# Patient Record
Sex: Female | Born: 1960 | Race: White | Hispanic: No | Marital: Single | Smoking: Current some day smoker
Health system: Southern US, Community
[De-identification: ages and names within clinical notes are randomized; demographics above are authoritative.]

## PROBLEM LIST (undated history)

## (undated) DIAGNOSIS — C801 Malignant (primary) neoplasm, unspecified: Secondary | ICD-10-CM

## (undated) DIAGNOSIS — E785 Hyperlipidemia, unspecified: Secondary | ICD-10-CM

## (undated) DIAGNOSIS — I219 Acute myocardial infarction, unspecified: Secondary | ICD-10-CM

## (undated) DIAGNOSIS — Z7901 Long term (current) use of anticoagulants: Secondary | ICD-10-CM

## (undated) DIAGNOSIS — I2510 Atherosclerotic heart disease of native coronary artery without angina pectoris: Secondary | ICD-10-CM

## (undated) DIAGNOSIS — J449 Chronic obstructive pulmonary disease, unspecified: Secondary | ICD-10-CM

## (undated) DIAGNOSIS — D45 Polycythemia vera: Secondary | ICD-10-CM

## (undated) DIAGNOSIS — I10 Essential (primary) hypertension: Secondary | ICD-10-CM

## (undated) DIAGNOSIS — N189 Chronic kidney disease, unspecified: Secondary | ICD-10-CM

## (undated) DIAGNOSIS — E119 Type 2 diabetes mellitus without complications: Secondary | ICD-10-CM

## (undated) DIAGNOSIS — R0600 Dyspnea, unspecified: Secondary | ICD-10-CM

## (undated) HISTORY — PX: LUNG LOBECTOMY: SHX167

## (undated) HISTORY — PX: PERICARDIAL WINDOW: SHX2213

---
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
## 2003-01-16 HISTORY — PX: PERICARDIAL WINDOW: SHX2213

## 2014-01-15 HISTORY — PX: LUNG LOBECTOMY: SHX167

## 2014-05-07 DIAGNOSIS — C3491 Malignant neoplasm of unspecified part of right bronchus or lung: Secondary | ICD-10-CM

## 2014-05-07 HISTORY — DX: Malignant neoplasm of unspecified part of right bronchus or lung: C34.91

## 2017-01-23 DIAGNOSIS — J449 Chronic obstructive pulmonary disease, unspecified: Secondary | ICD-10-CM | POA: Diagnosis present

## 2017-01-23 DIAGNOSIS — J441 Chronic obstructive pulmonary disease with (acute) exacerbation: Secondary | ICD-10-CM

## 2017-02-15 DIAGNOSIS — I219 Acute myocardial infarction, unspecified: Secondary | ICD-10-CM

## 2017-02-15 HISTORY — PX: CORONARY ANGIOPLASTY WITH STENT PLACEMENT: SHX49

## 2017-02-15 HISTORY — DX: Acute myocardial infarction, unspecified: I21.9

## 2017-02-17 HISTORY — PX: CORONARY ANGIOPLASTY WITH STENT PLACEMENT: SHX49

## 2017-02-24 DIAGNOSIS — K219 Gastro-esophageal reflux disease without esophagitis: Secondary | ICD-10-CM | POA: Diagnosis present

## 2017-02-24 DIAGNOSIS — E119 Type 2 diabetes mellitus without complications: Secondary | ICD-10-CM

## 2017-04-03 DIAGNOSIS — I2510 Atherosclerotic heart disease of native coronary artery without angina pectoris: Secondary | ICD-10-CM | POA: Diagnosis present

## 2017-11-13 ENCOUNTER — Ambulatory Visit
Admission: EM | Admit: 2017-11-13 | Discharge: 2017-11-13 | Disposition: A | Discharge status: Home / Self Care | Payer: Self-pay

## 2017-11-13 ENCOUNTER — Other Ambulatory Visit: Payer: Self-pay

## 2017-11-13 ENCOUNTER — Encounter: Payer: Self-pay | Admitting: Emergency Medicine

## 2017-11-13 DIAGNOSIS — I10 Essential (primary) hypertension: Secondary | ICD-10-CM

## 2017-11-13 DIAGNOSIS — R7309 Other abnormal glucose: Secondary | ICD-10-CM

## 2017-11-13 DIAGNOSIS — J449 Chronic obstructive pulmonary disease, unspecified: Secondary | ICD-10-CM

## 2017-11-13 DIAGNOSIS — R0982 Postnasal drip: Secondary | ICD-10-CM

## 2017-11-13 DIAGNOSIS — D45 Polycythemia vera: Secondary | ICD-10-CM

## 2017-11-13 DIAGNOSIS — E7849 Other hyperlipidemia: Secondary | ICD-10-CM

## 2017-11-13 HISTORY — DX: Polycythemia vera: D45

## 2017-11-13 HISTORY — DX: Acute myocardial infarction, unspecified: I21.9

## 2017-11-13 HISTORY — DX: Essential (primary) hypertension: I10

## 2017-11-13 HISTORY — DX: Malignant (primary) neoplasm, unspecified: C80.1

## 2017-11-13 LAB — COMPREHENSIVE METABOLIC PANEL
ALT: 21 U/L (ref 0–44)
ANION GAP: 12 (ref 5–15)
AST: 23 U/L (ref 15–41)
Albumin: 3.7 g/dL (ref 3.5–5.0)
Alkaline Phosphatase: 128 U/L — ABNORMAL HIGH (ref 38–126)
BILIRUBIN TOTAL: 0.4 mg/dL (ref 0.3–1.2)
BUN: 14 mg/dL (ref 6–20)
CHLORIDE: 98 mmol/L (ref 98–111)
CO2: 29 mmol/L (ref 22–32)
Calcium: 9.6 mg/dL (ref 8.9–10.3)
Creatinine, Ser: 0.78 mg/dL (ref 0.44–1.00)
Glucose, Bld: 143 mg/dL — ABNORMAL HIGH (ref 70–99)
POTASSIUM: 3.9 mmol/L (ref 3.5–5.1)
Sodium: 139 mmol/L (ref 135–145)
TOTAL PROTEIN: 7.8 g/dL (ref 6.5–8.1)

## 2017-11-13 MED ORDER — ALBUTEROL SULFATE HFA 108 (90 BASE) MCG/ACT IN AERS: 2.0000 | INHALATION_SPRAY | Freq: Four times a day (QID) | RESPIRATORY_TRACT | 1 refills | Status: DC | PRN

## 2017-11-13 MED ORDER — TRIAMTERENE-HCTZ 37.5-25 MG PO TABS: 1.0000 | ORAL_TABLET | Freq: Every day | ORAL | 1 refills | Status: DC

## 2017-11-13 MED ORDER — AMLODIPINE BESYLATE 10 MG PO TABS: 10.0000 mg | ORAL_TABLET | Freq: Every day | ORAL | 1 refills | Status: AC

## 2017-11-13 MED ORDER — LOSARTAN POTASSIUM 100 MG PO TABS: 100.0000 mg | ORAL_TABLET | Freq: Every day | ORAL | 1 refills | Status: AC

## 2017-11-13 MED ORDER — CLONIDINE HCL 0.1 MG PO TABS
0.1000 mg | ORAL_TABLET | Freq: Once | ORAL | Status: AC
  Administered 2017-11-13: 0.1 mg via ORAL

## 2017-11-13 MED ORDER — BENZONATATE 100 MG PO CAPS: 100.0000 mg | ORAL_CAPSULE | Freq: Three times a day (TID) | ORAL | 1 refills | Status: DC | PRN

## 2017-11-13 MED ORDER — SIMVASTATIN 20 MG PO TABS: 20.0000 mg | ORAL_TABLET | Freq: Every day | ORAL | 1 refills | Status: AC

## 2017-11-13 MED ORDER — CLOPIDOGREL BISULFATE 75 MG PO TABS: 75.0000 mg | ORAL_TABLET | Freq: Every day | ORAL | 1 refills | Status: DC

## 2017-11-13 MED ORDER — METOPROLOL SUCCINATE ER 100 MG PO TB24: 100.0000 mg | ORAL_TABLET | Freq: Every day | ORAL | 1 refills | Status: AC

## 2017-11-13 MED ORDER — TIOTROPIUM BROMIDE-OLODATEROL 2.5-2.5 MCG/ACT IN AERS: 2.0000 | INHALATION_SPRAY | Freq: Every day | RESPIRATORY_TRACT | 1 refills | Status: AC

## 2017-11-13 NOTE — ED Triage Notes (Signed)
Pt just moved here from [place] and is almost out of her blood pressure medication. She takes Losartan 100 mg, amlodipine 10 mg, metoprolol 100 mg, Triamterene/HCTZ, 37.5 mg -25 mg. She would like them printed out so that she use [facility] to see where her rx may be cheaper because her insurance doe snot go into effect until December.

## 2017-11-13 NOTE — Discharge Instructions (Addendum)
You were given Clonidine today to help lower your blood pressure. Recommend restart all your blood pressure medication. Restart Losartan 100mg  daily, Amlodipine 10mg , Toprol XL 100mg , Maxzide 25mg  daily. Continue Plavix 75mg  and aspirin 81mg  daily. Restart Simvastatin 20mg  daily. May continue Stiolto 2 puffs daily and Albuterol 2 puffs every 6 hours as needed. May use Claritin 10mg  daily and Benzonatate cough pills as needed. Call a PCP to schedule appointment for continued care and for referral to specialist for management of chronic health issues. If any chest pain, worsening dyspnea, worsening headache, dizziness, blurred vision, or syncope occurs, go to the ER ASAP. Otherwise follow-up with a PCP as planned.

## 2017-11-13 NOTE — ED Provider Notes (Signed)
[facility]    CSN: [id number] Arrival date & time: 11/13/17  1632     History   Chief Complaint Chief Complaint  Patient presents with  . Hypertension    HPI Nancy Combs is a 56 y.o. female.   57 year old female presents with multiple chronic health issues. She recently moved to [place] from [place] and has not found a PCP yet. She has a history of HTN, COPD, CAD with MI and stent placed in 02/2017. She also has a history of lung Cancer that was dx in 2016 with right lobectomy, chronic post nasal drainage and polycythemia vera. She would have blood draws to remove a pint of blood for tx. She has not had this treatment since April/May. She continues to smoke and had tried nicotine patches but had a reaction to the adhesive. She has been rationing her blood pressure medication and ran out of her diuretic and Toprol 3-4 weeks ago. She just obtained employment but her insurance does not start until December 2019. She has tried to make an appointment with local PCP but she can not get an appointment until November. She is having headaches due to her elevated blood pressure. She is also wheezing and getting short of breath since she also does not have her inhalers. Last visit with her PCP in [place] was in July with labwork done in June 2019. They had diagnosed her type 2 DM but she is not currently being treated. Uncertain if elevated OVFI4P due to polycythemia vera or a true result. She denies any fever, sore throat, vision changes, dizziness, chest pain or palpitations. She is requesting written Rx for all her medications if possible so that she can use Good Rx and find low-cost options. She will need referrals from a PCP for a local Oncologist, Hematologist, Cardiologist, and Pulmonologist for management of her chronic health conditions.   The history is provided by the patient.    Past Medical History:  Diagnosis Date  . Cancer ([place])    lung  . Hypertension   .  MI (myocardial infarction) ([place])   . PV (polycythemia vera) ([facility])     There are no active problems to display for this patient.   Past Surgical History:  Procedure Laterality Date  . CESAREAN SECTION    . CORONARY ANGIOPLASTY WITH STENT PLACEMENT  02/2017  . LUNG LOBECTOMY Right   . PERICARDIAL WINDOW      OB History   None      Home Medications    Prior to Admission medications   Medication Sig Start Date End Date Taking? Authorizing Provider  aspirin EC 81 MG tablet Take 81 mg by mouth daily.   Yes [provider]  folic acid (FOLVITE) 1 MG tablet Take 1 mg by mouth daily.   Yes [provider]  loratadine (CLARITIN) 10 MG tablet Take 10 mg by mouth daily.   Yes [provider]  albuterol (PROVENTIL HFA;VENTOLIN HFA) 108 (90 Base) MCG/ACT inhaler Inhale 2 puffs into the lungs every 6 (six) hours as needed for wheezing or shortness of breath. 11/13/17   Katy Apo, NP  amLODipine (NORVASC) 10 MG tablet Take 1 tablet (10 mg total) by mouth daily. 11/13/17   Katy Apo, NP  benzonatate (TESSALON) 100 MG capsule Take 1 capsule (100 mg total) by mouth 3 (three) times daily as needed for cough. 11/13/17   Katy Apo, NP  clopidogrel (PLAVIX) 75 MG tablet Take 1 tablet (  75 mg total) by mouth daily. 11/13/17   Katy Apo, NP  losartan (COZAAR) 100 MG tablet Take 1 tablet (100 mg total) by mouth daily. 11/13/17   Katy Apo, NP  metoprolol succinate (TOPROL-XL) 100 MG 24 hr tablet Take 1 tablet (100 mg total) by mouth daily. Take with or immediately following a meal. 11/13/17   Lethia Donlon, Nicholes Stairs, NP  simvastatin (ZOCOR) 20 MG tablet Take 1 tablet (20 mg total) by mouth daily. 11/13/17   Katy Apo, NP  Tiotropium Bromide-Olodaterol (STIOLTO RESPIMAT) 2.5-2.5 MCG/ACT AERS Inhale 2 puffs into the lungs daily. 11/13/17   Katy Apo, NP  triamterene-hydrochlorothiazide (MAXZIDE-25) 37.5-25 MG tablet Take 1 tablet by mouth  daily. 11/13/17   Katy Apo, NP    Family History Family History  Problem Relation Age of Onset  . Diabetes Mother   . Hypertension Mother     Social History Social History   Tobacco Use  . Smoking status: Current Every Day Smoker    Packs/day: 0.50    Types: Cigarettes  . Smokeless tobacco: Never Used  Substance Use Topics  . Alcohol use: Not on file  . Drug use: Never     Allergies   Patient has no known allergies.   Review of Systems Review of Systems  Constitutional: Positive for activity change (unable to walk up the stairs without wheezing) and fatigue. Negative for appetite change, chills, diaphoresis, fever and unexpected weight change.  HENT: Positive for postnasal drip. Negative for congestion, ear discharge, ear pain, facial swelling, mouth sores, nosebleeds, rhinorrhea, sinus pressure, sinus pain, sneezing, sore throat and trouble swallowing.   Eyes: Negative for photophobia and visual disturbance.  Respiratory: Positive for cough, shortness of breath and wheezing. Negative for chest tightness and stridor.   Cardiovascular: Positive for leg swelling (bilateral lower ankle swelling). Negative for chest pain and palpitations.  Gastrointestinal: Negative for abdominal pain, diarrhea, nausea and vomiting.  Endocrine: Positive for polyuria.  Genitourinary: Positive for frequency. Negative for decreased urine volume, difficulty urinating, dysuria, flank pain, hematuria and urgency.  Musculoskeletal: Negative for arthralgias, back pain, myalgias and neck pain.  Skin: Negative for color change, rash and wound.  Neurological: Positive for headaches. Negative for dizziness, tremors, seizures, syncope, speech difficulty, weakness, light-headedness and numbness.  Hematological: Negative for adenopathy. Bruises/bleeds easily (on Plavix).     Physical Exam Triage Vital Signs ED Triage Vitals  Enc Vitals Group     BP 11/13/17 1704 (!) 234/113     Pulse Rate  11/13/17 1704 82     Resp 11/13/17 1704 18     Temp 11/13/17 1704 97.9 F (36.6 C)     Temp Source 11/13/17 1704 Oral     SpO2 11/13/17 1704 100 %     Weight 11/13/17 1659 220 lb (99.8 kg)     Height 11/13/17 1659 5' 2.75" (1.594 m)     Head Circumference --      Peak Flow --      Pain Score 11/13/17 1659 0     Pain Loc --      Pain Edu? --      Excl. in [place]? --    No data found.  Updated Vital Signs BP (!) 185/104 (BP Location: Left Arm)   Pulse 82   Temp 97.9 F (36.6 C) (Oral)   Resp 18   Ht 5' 2.75" (1.594 m)   Wt 220 lb (99.8 kg)   SpO2 100%   BMI 39.28  kg/m   Visual Acuity Right Eye Distance:   Left Eye Distance:   Bilateral Distance:    Right Eye Near:   Left Eye Near:    Bilateral Near:     Physical Exam  Constitutional: She is oriented to person, place, and time. She appears well-developed and well-nourished. She is cooperative. She does not appear ill. No distress.  Patient sitting comfortably on exam table in no acute distress. Is currently asymptomatic except for headache.   HENT:  Head: Normocephalic and atraumatic.  Right Ear: Hearing, tympanic membrane, external ear and ear canal normal.  Left Ear: Hearing, tympanic membrane, external ear and ear canal normal.  Nose: Nose normal.  Mouth/Throat: Uvula is midline, oropharynx is clear and moist and mucous membranes are normal.  Eyes: Pupils are equal, round, and reactive to light. Conjunctivae and EOM are normal.  Neck: Normal range of motion. Neck supple.  Cardiovascular: Normal rate, regular rhythm, normal heart sounds, intact distal pulses and normal pulses.  No murmur heard. Pulmonary/Chest: Effort normal. No stridor. No respiratory distress. She has wheezes in the right upper field, the right lower field, the left upper field and the left lower field. She has no rhonchi. She has no rales.  Musculoskeletal: Normal range of motion.       Right ankle: She exhibits swelling. She exhibits normal range  of motion, no ecchymosis and normal pulse.       Left ankle: She exhibits swelling. She exhibits normal range of motion, no ecchymosis and normal pulse.  Bilateral 2+ non-pitting edema present. Normal 2+ dorsal pedal pulses.    Lymphadenopathy:    She has no cervical adenopathy.  Neurological: She is alert and oriented to person, place, and time.  Skin: Skin is warm, dry and intact. Capillary refill takes less than 2 seconds. No rash noted. Nails show clubbing.  Psychiatric: She has a normal mood and affect. Her behavior is normal. Thought content normal. Her speech is rapid and/or pressured. Cognition and memory are normal.  Vitals reviewed.    UC Treatments / Results  Labs (all labs ordered are listed, but only abnormal results are displayed) Labs Reviewed  COMPREHENSIVE METABOLIC PANEL - Abnormal; Notable for the following components:      Result Value   Glucose, Bld 143 (*)    Alkaline Phosphatase 128 (*)    All other components within normal limits    EKG None  Radiology No results found.  Procedures Procedures (including critical care time)  Medications Ordered in UC Medications  cloNIDine (CATAPRES) tablet 0.1 mg (0.1 mg Oral Given 11/13/17 1750)    Initial Impression / Assessment and Plan / UC Course  I have reviewed the triage vital signs and the nursing notes.  Pertinent labs & imaging results that were available during my care of the patient were reviewed by me and considered in my medical decision making (see chart for details).    Blood pressure significantly elevated but patient stable and indicates that it has been this high before. Gave Clonidine 0.1mg  now to help decrease blood pressure. Reviewed metabolic panel lab results with patient- only distinct abnormality is elevated glucose. Normal kidney function. Will not start diabetic medication at this time since glucose not severely elevated - patient is asymptomatic and no findings suggestive of DKA. Will  allow PCP to manage abnormal glucose. Recommend restart blood pressure medications- Losartan 100mg , Amilodopine 10mg , Toprol XL 100mg , Maxzide 25mg  daily. Continue Plavix 75mg  and aspirin 81mg  daily. Restart Simvastatin- patient was  on Lipitor but thinks it caused her to cough so was switched to Zocor but uncertain. Will restart Zocor at 20mg  but should be adjusted by PCP. Restart Stiolto 2 puffs AM and Albuterol 2 puffs every 6 hours prn for COPD. May use Tessalon cough pills and Claritin 10mg  as directed for PND. All prescriptions printed so patient can determine most cost-effective pharmacy for meds.  Call PCP tomorrow to schedule appointment for continued care. Recommend assistance in finding PCP due to no current insurance and new to area. If any chest pain, worsening dyspnea, dizziness, syncope or worsening of headache occurs, go to ER ASAP. Otherwise follow-up with a PCP as planned.  Final Clinical Impressions(s) / UC Diagnoses   Final diagnoses:  Essential hypertension  Elevated blood pressure reading with diagnosis of hypertension  Other hyperlipidemia  COPD, moderate ([facility]CC)  Post-nasal drainage  Polycythemia vera ([facility]CC)  Elevated glucose level     Discharge Instructions     You were given Clonidine today to help lower your blood pressure. Recommend restart all your blood pressure medication. Restart Losartan 100mg  daily, Amlodipine 10mg , Toprol XL 100mg , Maxzide 25mg  daily. Continue Plavix 75mg  and aspirin 81mg  daily. Restart Simvastatin 20mg  daily. May continue Stiolto 2 puffs daily and Albuterol 2 puffs every 6 hours as needed. May use Claritin 10mg  daily and Benzonatate cough pills as needed. Call a PCP to schedule appointment for continued care and for referral to specialist for management of chronic health issues. If any chest pain, worsening dyspnea, worsening headache, dizziness, blurred vision, or syncope occurs, go to the ER ASAP. Otherwise follow-up with a PCP as planned.     ED  Prescriptions    Medication Sig Dispense Auth. Provider   amLODipine (NORVASC) 10 MG tablet Take 1 tablet (10 mg total) by mouth daily. 30 tablet Katy Apo, NP   losartan (COZAAR) 100 MG tablet Take 1 tablet (100 mg total) by mouth daily. 30 tablet Katy Apo, NP   metoprolol succinate (TOPROL-XL) 100 MG 24 hr tablet Take 1 tablet (100 mg total) by mouth daily. Take with or immediately following a meal. 30 tablet Kemoni Ortega, Nicholes Stairs, NP   triamterene-hydrochlorothiazide (MAXZIDE-25) 37.5-25 MG tablet Take 1 tablet by mouth daily. 30 tablet Katy Apo, NP   clopidogrel (PLAVIX) 75 MG tablet Take 1 tablet (75 mg total) by mouth daily. 30 tablet Katy Apo, NP   simvastatin (ZOCOR) 20 MG tablet Take 1 tablet (20 mg total) by mouth daily. 30 tablet Katy Apo, NP   albuterol (PROVENTIL HFA;VENTOLIN HFA) 108 (90 Base) MCG/ACT inhaler Inhale 2 puffs into the lungs every 6 (six) hours as needed for wheezing or shortness of breath. 1 Inhaler Yeraldine Forney, Nicholes Stairs, NP   benzonatate (TESSALON) 100 MG capsule Take 1 capsule (100 mg total) by mouth 3 (three) times daily as needed for cough. 30 capsule Katy Apo, NP   Tiotropium Bromide-Olodaterol (STIOLTO RESPIMAT) 2.5-2.5 MCG/ACT AERS Inhale 2 puffs into the lungs daily. 1 Inhaler Wanna Gully, Nicholes Stairs, NP     Controlled Substance Prescriptions [place] Controlled Substance Registry consulted? No.    Katy Apo, NP 11/14/17 [phone]

## 2019-04-03 ENCOUNTER — Other Ambulatory Visit: Payer: Self-pay | Admitting: Obstetrics and Gynecology

## 2019-04-03 DIAGNOSIS — Z1231 Encounter for screening mammogram for malignant neoplasm of breast: Secondary | ICD-10-CM

## 2019-04-10 ENCOUNTER — Other Ambulatory Visit: Payer: Self-pay | Admitting: Orthopedic Surgery

## 2019-04-10 DIAGNOSIS — M2392 Unspecified internal derangement of left knee: Secondary | ICD-10-CM

## 2019-05-11 ENCOUNTER — Inpatient Hospital Stay: Admission: RE | Admit: 2019-05-11 | Payer: Self-pay | Source: Ambulatory Visit

## 2019-11-05 ENCOUNTER — Other Ambulatory Visit: Payer: Self-pay | Admitting: Surgery

## 2019-11-12 ENCOUNTER — Inpatient Hospital Stay: Admission: RE | Admit: 2019-11-12 | Payer: Self-pay | Source: Ambulatory Visit

## 2019-11-12 ENCOUNTER — Encounter: Payer: Self-pay | Admitting: Surgery

## 2019-11-12 ENCOUNTER — Other Ambulatory Visit: Payer: Self-pay

## 2019-11-12 ENCOUNTER — Encounter
Admission: RE | Admit: 2019-11-12 | Discharge: 2019-11-12 | Disposition: A | Discharge status: Home / Self Care | Payer: BC Managed Care – PPO | Source: Ambulatory Visit | Attending: Surgery | Admitting: Surgery

## 2019-11-12 DIAGNOSIS — Z01818 Encounter for other preprocedural examination: Secondary | ICD-10-CM | POA: Diagnosis present

## 2019-11-12 DIAGNOSIS — Z01810 Encounter for preprocedural cardiovascular examination: Secondary | ICD-10-CM

## 2019-11-12 HISTORY — DX: Chronic kidney disease, unspecified: N18.9

## 2019-11-12 HISTORY — DX: Chronic obstructive pulmonary disease, unspecified: J44.9

## 2019-11-12 HISTORY — DX: Type 2 diabetes mellitus without complications: E11.9

## 2019-11-12 HISTORY — DX: Dyspnea, unspecified: R06.00

## 2019-11-12 LAB — URINALYSIS, ROUTINE W REFLEX MICROSCOPIC
Bilirubin Urine: NEGATIVE
Glucose, UA: NEGATIVE mg/dL
Hgb urine dipstick: NEGATIVE
Ketones, ur: NEGATIVE mg/dL
Leukocytes,Ua: NEGATIVE
Nitrite: NEGATIVE
Protein, ur: 100 mg/dL — AB
Specific Gravity, Urine: 1.014 (ref 1.005–1.030)
pH: 7 (ref 5.0–8.0)

## 2019-11-12 LAB — CBC WITH DIFFERENTIAL/PLATELET
Abs Immature Granulocytes: 0.15 10*3/uL — ABNORMAL HIGH (ref 0.00–0.07)
Basophils Absolute: 0.1 10*3/uL (ref 0.0–0.1)
Basophils Relative: 1 %
Eosinophils Absolute: 0.3 10*3/uL (ref 0.0–0.5)
Eosinophils Relative: 2 %
HCT: 43.1 % (ref 36.0–46.0)
Hemoglobin: 14.7 g/dL (ref 12.0–15.0)
Immature Granulocytes: 1 %
Lymphocytes Relative: 17 %
Lymphs Abs: 2.2 10*3/uL (ref 0.7–4.0)
MCH: 30.1 pg (ref 26.0–34.0)
MCHC: 34.1 g/dL (ref 30.0–36.0)
MCV: 88.3 fL (ref 80.0–100.0)
Monocytes Absolute: 0.9 10*3/uL (ref 0.1–1.0)
Monocytes Relative: 7 %
Neutro Abs: 9 10*3/uL — ABNORMAL HIGH (ref 1.7–7.7)
Neutrophils Relative %: 72 %
Platelets: 294 10*3/uL (ref 150–400)
RBC: 4.88 MIL/uL (ref 3.87–5.11)
RDW: 13.5 % (ref 11.5–15.5)
WBC: 12.6 10*3/uL — ABNORMAL HIGH (ref 4.0–10.5)
nRBC: 0 % (ref 0.0–0.2)

## 2019-11-12 LAB — SURGICAL PCR SCREEN
MRSA, PCR: NEGATIVE
Staphylococcus aureus: NEGATIVE

## 2019-11-12 LAB — COMPREHENSIVE METABOLIC PANEL
ALT: 19 U/L (ref 0–44)
AST: 14 U/L — ABNORMAL LOW (ref 15–41)
Albumin: 3.5 g/dL (ref 3.5–5.0)
Alkaline Phosphatase: 101 U/L (ref 38–126)
Anion gap: 9 (ref 5–15)
BUN: 18 mg/dL (ref 6–20)
CO2: 33 mmol/L — ABNORMAL HIGH (ref 22–32)
Calcium: 9.7 mg/dL (ref 8.9–10.3)
Chloride: 96 mmol/L — ABNORMAL LOW (ref 98–111)
Creatinine, Ser: 0.85 mg/dL (ref 0.44–1.00)
GFR, Estimated: >60 mL/min (ref >60)
Glucose, Bld: 129 mg/dL — ABNORMAL HIGH (ref 70–99)
Potassium: 3.6 mmol/L (ref 3.5–5.1)
Sodium: 138 mmol/L (ref 135–145)
Total Bilirubin: 0.6 mg/dL (ref 0.3–1.2)
Total Protein: 7 g/dL (ref 6.5–8.1)

## 2019-11-12 LAB — TYPE AND SCREEN
ABO/RH(D): O POS
Antibody Screen: NEGATIVE

## 2019-11-12 NOTE — Patient Instructions (Signed)
Your procedure is scheduled on: Thursday November 19, 2019. Report to Day Surgery inside [facility] 2[facility] Floor. To find out your arrival time please call [phone] between 1PM - 3PM on Wednesday November 18, 2019.  Remember: Instructions that are not followed completely may result in serious medical risk,  up to and including death, or upon the discretion of your surgeon and anesthesiologist your  surgery may need to be rescheduled.     _X__ 1. Do not eat food after midnight the night before your procedure.                 No chewing gum or hard candies. You may drink clear liquids up to 2 hours                 before you are scheduled to arrive for your surgery- DO not drink clear                 liquids within 2 hours of the start of your surgery.                 Clear Liquids include:  water, apple juice without pulp, clear Gatorade, G2 or                  Gatorade Zero (avoid Red/Purple/Blue), Black Coffee or Tea (Do not add                 anything to coffee or tea).  __X__2.   Complete the "Ensure Clear Pre-surgery Clear Carbohydrate Drink" provided to you, 2 hours before arrival. **If you are diabetic you will be provided with an alternative drink, Gatorade Zero or G2.  __X__3.  On the morning of surgery brush your teeth with toothpaste and water, you                may rinse your mouth with mouthwash if you wish.  Do not swallow any toothpaste of mouthwash.     _X__4.  No Alcohol for 24 hours before or after surgery.   _X__5.  Do Not Smoke or use e-cigarettes For 24 Hours Prior to Your Surgery.                 Do not use any chewable tobacco products for at least 6 hours prior to                 Surgery.  _X__ 6.  Do not use any recreational drugs (marijuana, cocaine, heroin, ecstasy, MDMA or other)                For at least one week prior to your surgery.  Combination of these drugs with anesthesia                May have life threatening  results.  _X___  7.  Notify your doctor if there is any change in your medical condition      (cold, fever, infections).     Do not wear jewelry, make-up, hairpins, clips or nail polish. Do not wear lotions, powders, or perfumes. You may wear deodorant. Do not shave 48 hours prior to surgery. Men may shave face and neck. Do not bring valuables to the hospital.    [facility] is not responsible for any belongings or valuables.  Contacts, dentures or bridgework may not be worn into surgery. Leave your suitcase in the car. After surgery it may be brought to your room. For patients admitted to  the hospital, discharge time is determined by your treatment team.   Patients discharged the day of surgery will not be allowed to drive home.   Make arrangements for someone to be with you for the first 24 hours of your Same Day Discharge.   __X__ Take these medicines the morning of surgery with A SIP OF WATER:    1. amLODipine (NORVASC) 10 MG   2. metoprolol succinate (TOPROL-XL) 100 MG    ____ Fleet Enema (as directed)   __x__ Use CHG Soap as directed  ____ Use Benzoyl Peroxide Gel as instructed  __x__ Use inhalers on the day of surgery  albuterol (PROVENTIL HFA;VENTOLIN HFA) 108 (90 Base) MCG/ACT inhaler  Tiotropium Bromide-Olodaterol (STIOLTO RESPIMAT) 2.5-2.5 MCG/ACT AERS  __X__ Stop metformin 2 days prior to surgery (last dose will be Monday November 16, 2019)     ____ Take 1/2 of usual insulin dose the night before surgery. No insulin the morning          of surgery.   __x__ Stop clopidogrel (PLAVIX) and aspirin EC 81 MG  as instructed by your doctor   __x__ Stop Anti-inflammatories such as Ibuprofen, Aleve, Advil, naproxen and or BC powders.    __x__ Stop supplements until after surgery.    __x__ Do not start any herbal supplements before your procedure.    If you have any questions regarding your pre-procedure instructions,  Please call Pre-admit Testing at  [phone].

## 2019-11-12 NOTE — Progress Notes (Signed)
[facility] Perioperative Services  Pre-Admission/Anesthesia Testing Clinical Review  Date: 11/13/19  Patient Demographics:  Name: Nancy Combs DOB:   May 27, 1960 MRN:   [id number]  Planned Surgical Procedure(s):    Case: [id number] Date/Time: 11/19/19 0715   Procedure: TOTAL KNEE ARTHROPLASTY (Left Knee)   Anesthesia type: Choice   Pre-op diagnosis: PRIMARY OSTEOARTHRITIS OF LEFT KNEE   Location: [place] 03 / [place] [facility]S FOR ANESTHESIA GROUP   Surgeons: Corky Mull, MD     NOTE: Available PAT nursing documentation and vital signs have been reviewed. Clinical nursing staff has updated patient's PMH/PSHx, current medication list, and drug allergies/intolerances to ensure comprehensive history available to assist in medical decision making as it pertains to the aforementioned surgical procedure and anticipated anesthetic course.   Clinical Discussion:  Nancy Combs is a 58 y.o. female who is submitted for pre-surgical anesthesia review and clearance prior to her undergoing the above procedure. Patient is a Former Smoker (50 pack years; quit 08/2019). Pertinent PMH includes: CAD, MI, HTN, T2DM, polycythemia rubra vera, COPD, CKD, lung cancer.  Patient is followed by cardiology Saralyn Pilar, MD). She was last seen in the cardiology clinic on 05/11/2019; notes reviewed.  At the time of clinic visit, patient complained of episodic left-sided sharp chest pain and exertional dyspnea. Smoking cessation was encouraged.  Patient is followed by oncology at [facility]; last visit 10/13/2019.  Patient status post PTCA in 2015.  Last cardiac catheterization performed in 03/06/2017 resulting in placement of a DES x1 to her RCA.  Other pertinent cardiac history included a pericardial window and 2005; no notes available regarding this procedure. Patient was seen in follow-up following cardiovascular testing before performed on 05/01/2019.  TTE revealed normal LV systolic function with an LVEF  of >55%.  Myocardial perfusion imaging revealed moderate anterior apical scar without significant ischemia; LVEF 55%.  Hypertension well controlled on ARB, beta-blocker, calcium channel blocker, and thiazide diuretic therapy.  She is on a statin for HLD.  T2DM loosely controlled on multi agent therapy; last A1c was 8.8% on 03/14/2018.  Patient scheduled to follow-up with outpatient cardiology in 4 months.  In light of patient's past medical history significant for cardiovascular issues, presurgical cardiac clearance was sought by the PAT team.  Per cardiology, "this patient is optimized for surgery and may proceed with the planned procedural course with a LOW risk stratification".  This patient is on daily DAPT therapy (clopidogrel + ASA). She has will been instructed on recommendations for holding her daily clopidogrel for 5 days, and her daily low-dose ASA for 7 days, prior to her procedure.   She denies previous perioperative complications with anesthesia.  Vitals with BMI 11/12/2019 11/13/2017 11/13/2017  Height 5' 2.75" - -  Weight 217 lbs - -  BMI 79.02 - -  Systolic 409 735 329  Diastolic 924 268 341  Pulse 96 - -    Providers/Specialists:   NOTE: Primary physician provider listed below. Patient may have been seen by APP or partner within same practice.   PROVIDER ROLE LAST OV  Poggi, Marshall Cork, MD Orthopedics (Surgeon) 10/12/2019  Hortencia Pilar, MD Primary Care Provider 03/20/2019  Isaias Cowman, MD Cardiology 05/11/2019  Marcie Mowers, MD Oncology 10/13/2019   Allergies:  Patient has no known allergies.  Current Home Medications:   No current facility-administered medications for this encounter.   Marland Kitchen albuterol (PROVENTIL HFA;VENTOLIN HFA) 108 (90 Base) MCG/ACT inhaler  . amLODipine (NORVASC) 10 MG tablet  . aspirin EC 81 MG  tablet  . clopidogrel (PLAVIX) 75 MG tablet  . folic acid (FOLVITE) 1 MG tablet  . glipiZIDE (GLUCOTROL) 5 MG tablet  . hydrochlorothiazide  (HYDRODIURIL) 25 MG tablet  . loratadine (CLARITIN) 10 MG tablet  . losartan (COZAAR) 100 MG tablet  . metFORMIN (GLUCOPHAGE-XR) 500 MG 24 hr tablet  . metoprolol succinate (TOPROL-XL) 100 MG 24 hr tablet  . simvastatin (ZOCOR) 20 MG tablet  . benzonatate (TESSALON) 100 MG capsule  . Tiotropium Bromide-Olodaterol (STIOLTO RESPIMAT) 2.5-2.5 MCG/ACT AERS  . triamterene-hydrochlorothiazide (MAXZIDE-25) 37.5-25 MG tablet   History:   Past Medical History:  Diagnosis Date  . CAD (coronary artery disease)   . Cancer ([place])    lung  . 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 COPD (chronic obstructive pulmonary disease) ([place])   . Diabetes mellitus without complication ([place])   . Dyspnea   . Hypertension   . MI (myocardial infarction) ([place])   . PV (polycythemia vera) ([place])    Past Surgical History:  Procedure Laterality Date  . CESAREAN SECTION    . CORONARY ANGIOPLASTY WITH STENT PLACEMENT  02/2017  . LUNG LOBECTOMY Right   . PERICARDIAL WINDOW    . THYROIDECTOMY, PARTIAL  1982   Family History  Problem Relation Age of Onset  . Diabetes Mother   . Hypertension Mother    Social History   Tobacco Use  . Smoking status: Former Smoker    Packs/day: 0.50    Types: Cigarettes    Quit date: 08/26/2019    Years since quitting: 0.2  . Smokeless tobacco: Never Used  Vaping Use  . Vaping Use: Never used  Substance Use Topics  . Alcohol use: Never  . Drug use: Never    Pertinent Clinical Results:  LABS: Labs reviewed: Acceptable for surgery.  Hospital Outpatient Visit on 11/12/2019  Component Date Value Ref Range Status  . MRSA, PCR 11/12/2019 NEGATIVE  NEGATIVE Final  . Staphylococcus aureus 11/12/2019 NEGATIVE  NEGATIVE Final   Comment: (NOTE) The Xpert SA Assay (FDA approved for NASAL specimens in patients 103 years of age and older), is one component of a comprehensive surveillance program. It is not intended to diagnose infection nor to guide or monitor  treatment. Performed at [facility], [address] [place], [place] [address]   . WBC 11/12/2019 12.6* 4.0 - 10.5 K/uL Final  . RBC 11/12/2019 4.88  3.87 - 5.11 MIL/uL Final  . Hemoglobin 11/12/2019 14.7  12.0 - 15.0 g/dL Final  . HCT 11/12/2019 43.1  36 - 46 % Final  . MCV 11/12/2019 88.3  80.0 - 100.0 fL Final  . MCH 11/12/2019 30.1  26.0 - 34.0 pg Final  . MCHC 11/12/2019 34.1  30.0 - 36.0 g/dL Final  . RDW 11/12/2019 13.5  11.5 - 15.5 % Final  . Platelets 11/12/2019 294  150 - 400 K/uL Final  . nRBC 11/12/2019 0.0  0.0 - 0.2 % Final  . Neutrophils Relative % 11/12/2019 72  % Final  . Neutro Abs 11/12/2019 9.0* 1.7 - 7.7 K/uL Final  . Lymphocytes Relative 11/12/2019 17  % Final  . Lymphs Abs 11/12/2019 2.2  0.7 - 4.0 K/uL Final  . Monocytes Relative 11/12/2019 7  % Final  . Monocytes Absolute 11/12/2019 0.9  0.1 - 1.0 K/uL Final  . Eosinophils Relative 11/12/2019 2  % Final  . Eosinophils Absolute 11/12/2019 0.3  0.0 - 0.5 K/uL Final  . Basophils Relative 11/12/2019 1  %  Final  . Basophils Absolute 11/12/2019 0.1  0.0 - 0.1 K/uL Final  . Immature Granulocytes 11/12/2019 1  % Final  . Abs Immature Granulocytes 11/12/2019 0.15* 0.00 - 0.07 K/uL Final   Performed at [facility], [address] [place], [place] [address]  . Sodium 11/12/2019 138  135 - 145 mmol/L Final  . Potassium 11/12/2019 3.6  3.5 - 5.1 mmol/L Final  . Chloride 11/12/2019 96* 98 - 111 mmol/L Final  . CO2 11/12/2019 33* 22 - 32 mmol/L Final  . Glucose, Bld 11/12/2019 129* 70 - 99 mg/dL Final   Glucose reference range applies only to samples taken after fasting for at least 8 hours.  . BUN 11/12/2019 18  6 - 20 mg/dL Final  . Creatinine, Ser 11/12/2019 0.85  0.44 - 1.00 mg/dL Final  . Calcium 11/12/2019 9.7  8.9 - 10.3 mg/dL Final  . Total Protein 11/12/2019 7.0  6.5 - 8.1 g/dL Final  . Albumin 11/12/2019 3.5  3.5 - 5.0 g/dL Final  . AST 11/12/2019 14* 15 - 41 U/L Final  . ALT  11/12/2019 19  0 - 44 U/L Final  . Alkaline Phosphatase 11/12/2019 101  38 - 126 U/L Final  . Total Bilirubin 11/12/2019 0.6  0.3 - 1.2 mg/dL Final  . GFR, Estimated 11/12/2019 >60  >60 mL/min Final   Comment: (NOTE) Calculated using the CKD-EPI Creatinine Equation (2021)   . Anion gap 11/12/2019 9  5 - 15 Final   Performed at [facility], [address] [place], [place] [address]  . ABO/RH(D) 11/12/2019 O POS   Final  . Antibody Screen 11/12/2019 NEG   Final  . Sample Expiration 11/12/2019 11/26/2019,2359   Final  . Extend sample reason 11/12/2019    Final                   Value:NO TRANSFUSIONS OR PREGNANCY IN THE PAST 3 MONTHS Performed at [facility], [address] [place], [place] [address]   . Color, Urine 11/12/2019 YELLOW* YELLOW Final  . APPearance 11/12/2019 HAZY* CLEAR Final  . Specific Gravity, Urine 11/12/2019 1.014  1.005 - 1.030 Final  . pH 11/12/2019 7.0  5.0 - 8.0 Final  . Glucose, UA 11/12/2019 NEGATIVE  NEGATIVE mg/dL Final  . Hgb urine dipstick 11/12/2019 NEGATIVE  NEGATIVE Final  . Bilirubin Urine 11/12/2019 NEGATIVE  NEGATIVE Final  . Ketones, ur 11/12/2019 NEGATIVE  NEGATIVE mg/dL Final  . Protein, ur 11/12/2019 100* NEGATIVE mg/dL Final  . Nitrite 11/12/2019 NEGATIVE  NEGATIVE Final  . Chalmers Guest 11/12/2019 NEGATIVE  NEGATIVE Final  . RBC / HPF 11/12/2019 0-5  0 - 5 RBC/hpf Final  . WBC, UA 11/12/2019 0-5  0 - 5 WBC/hpf Final  . Bacteria, UA 11/12/2019 RARE* NONE SEEN Final  . Squamous Epithelial / LPF 11/12/2019 0-5  0 - 5 Final  . Mucus 11/12/2019 PRESENT   Final   Performed at [facility], [address] [place],  [address]    ECG: Date: 11/12/2019 Time ECG obtained: 1328 PM Rate: 57 bpm Rhythm: Sinus bradycardia with first-degree AV block; pulmonary disease pattern Axis (leads I and aVF): Right axis deviation Intervals: PR 220 ms. QRS 100 ms. QTc 467 ms. ST segment and T wave changes: No evidence of  acute ST segment elevation or depression Comparison: When compared to last tracing performed on 05/04/2019, PR interval increased and a first-degree AV block now present.   IMAGING / PROCEDURES: ECHOCARDIOGRAM done on  05/04/2019 1. LVEF >55% 2. Normal LV systolic function 3. Normal RV systolic function 4. Trivial MR, TR, PR; no AR 5. No valvular stenosis 6. Mild LVH  LEXISCAN done on 05/01/2019 1. LVEF 55% 2. Regional wall motion reveals normal myocardial thickening and wall motion 3. Overall quality of study is good 4. No artifacts noted 5. Normal LV cavity 6. Moderate anterior apical scar without significant ischemia   Impression and Plan:  Nancy Combs has been referred for pre-anesthesia review and clearance prior to her undergoing the planned anesthetic and procedural courses. Available labs, pertinent testing, and imaging results were personally reviewed by me. ECG reviewed with primary cardiologist who advised that "ECG was ok" when compared to last tracing. This patient has been appropriately cleared by cardiology with a LOW risk stratification.   Based on clinical review performed today (11/13/19), barring any significant acute changes in the patient's overall condition, it is anticipated that she will be able to proceed with the planned surgical intervention. Any acute changes in clinical condition may necessitate her procedure being postponed and/or cancelled. Pre-surgical instructions were reviewed with the patient during her PAT appointment and questions were fielded by PAT clinical staff.  Honor Loh, MSN, APRN, FNP-C, CEN [facility]  Peri-operative Services Nurse Practitioner Phone: [phone] 11/13/19 1:42 PM  NOTE: This note has been prepared using Dragon dictation software. Despite my best ability to proofread, there is always the potential that unintentional transcriptional errors may still occur from this process.

## 2019-11-17 ENCOUNTER — Other Ambulatory Visit
Admission: RE | Admit: 2019-11-17 | Discharge: 2019-11-17 | Disposition: A | Discharge status: Home / Self Care | Payer: BC Managed Care – PPO | Source: Ambulatory Visit | Attending: Surgery | Admitting: Surgery

## 2019-11-17 ENCOUNTER — Other Ambulatory Visit: Payer: Self-pay

## 2019-11-17 DIAGNOSIS — Z20822 Contact with and (suspected) exposure to covid-19: Secondary | ICD-10-CM | POA: Insufficient documentation

## 2019-11-17 DIAGNOSIS — Z01812 Encounter for preprocedural laboratory examination: Secondary | ICD-10-CM | POA: Insufficient documentation

## 2019-11-17 LAB — SARS CORONAVIRUS 2 (TAT 6-24 HRS): SARS Coronavirus 2: NEGATIVE

## 2019-11-19 ENCOUNTER — Encounter: Payer: Self-pay | Admitting: Surgery

## 2019-11-19 ENCOUNTER — Ambulatory Visit: Payer: BC Managed Care – PPO | Admitting: Urgent Care

## 2019-11-19 ENCOUNTER — Ambulatory Visit
Admission: RE | Admit: 2019-11-19 | Discharge: 2019-11-19 | Disposition: A | Discharge status: Home / Self Care | Payer: BC Managed Care – PPO | Attending: Surgery | Admitting: Surgery

## 2019-11-19 ENCOUNTER — Other Ambulatory Visit: Payer: Self-pay

## 2019-11-19 ENCOUNTER — Ambulatory Visit: Payer: BC Managed Care – PPO

## 2019-11-19 ENCOUNTER — Encounter
Admission: RE | Disposition: A | Discharge status: Home / Self Care | Payer: Self-pay | Source: Home / Self Care | Attending: Surgery

## 2019-11-19 DIAGNOSIS — M1712 Unilateral primary osteoarthritis, left knee: Secondary | ICD-10-CM | POA: Diagnosis present

## 2019-11-19 DIAGNOSIS — Z6841 Body Mass Index (BMI) 40.0 and over, adult: Secondary | ICD-10-CM | POA: Diagnosis not present

## 2019-11-19 DIAGNOSIS — Z79899 Other long term (current) drug therapy: Secondary | ICD-10-CM | POA: Diagnosis not present

## 2019-11-19 DIAGNOSIS — R262 Difficulty in walking, not elsewhere classified: Secondary | ICD-10-CM | POA: Diagnosis not present

## 2019-11-19 DIAGNOSIS — Z7984 Long term (current) use of oral hypoglycemic drugs: Secondary | ICD-10-CM | POA: Diagnosis not present

## 2019-11-19 DIAGNOSIS — Z96652 Presence of left artificial knee joint: Secondary | ICD-10-CM

## 2019-11-19 DIAGNOSIS — Z419 Encounter for procedure for purposes other than remedying health state, unspecified: Secondary | ICD-10-CM

## 2019-11-19 DIAGNOSIS — Z87891 Personal history of nicotine dependence: Secondary | ICD-10-CM | POA: Diagnosis not present

## 2019-11-19 DIAGNOSIS — Z955 Presence of coronary angioplasty implant and graft: Secondary | ICD-10-CM | POA: Diagnosis not present

## 2019-11-19 DIAGNOSIS — Z7982 Long term (current) use of aspirin: Secondary | ICD-10-CM | POA: Insufficient documentation

## 2019-11-19 HISTORY — PX: TOTAL KNEE ARTHROPLASTY: SHX125

## 2019-11-19 HISTORY — DX: Atherosclerotic heart disease of native coronary artery without angina pectoris: I25.10

## 2019-11-19 LAB — GLUCOSE, CAPILLARY
Glucose-Capillary: 144 mg/dL — ABNORMAL HIGH (ref 70–99)
Glucose-Capillary: 131 mg/dL — ABNORMAL HIGH (ref 70–99)

## 2019-11-19 SURGERY — ARTHROPLASTY, KNEE, TOTAL
Anesthesia: Spinal | Site: Knee | Laterality: Left

## 2019-11-19 MED ORDER — EPINEPHRINE PF 1 MG/ML IJ SOLN
INTRAMUSCULAR | Status: AC
  Filled 2019-11-19: qty 1

## 2019-11-19 MED ORDER — BUPIVACAINE HCL (PF) 0.5 % IJ SOLN
INTRAMUSCULAR | Status: AC
  Filled 2019-11-19: qty 10

## 2019-11-19 MED ORDER — FENTANYL CITRATE (PF) 100 MCG/2ML IJ SOLN
INTRAMUSCULAR | Status: AC
  Filled 2019-11-19: qty 2

## 2019-11-19 MED ORDER — ACETAMINOPHEN 10 MG/ML IV SOLN
INTRAVENOUS | Status: DC | PRN
  Administered 2019-11-19: 1000 mg via INTRAVENOUS

## 2019-11-19 MED ORDER — SODIUM CHLORIDE 0.9 % IV SOLN
INTRAVENOUS | Status: DC | PRN
  Administered 2019-11-19: 30 ug/min via INTRAVENOUS

## 2019-11-19 MED ORDER — ACETAMINOPHEN 500 MG PO TABS: 1000.0000 mg | ORAL_TABLET | Freq: Four times a day (QID) | ORAL | Status: DC

## 2019-11-19 MED ORDER — ACETAMINOPHEN 10 MG/ML IV SOLN
INTRAVENOUS | Status: AC
  Filled 2019-11-19: qty 100

## 2019-11-19 MED ORDER — MIDAZOLAM HCL 5 MG/5ML IJ SOLN
INTRAMUSCULAR | Status: DC | PRN
  Administered 2019-11-19: 2 mg via INTRAVENOUS

## 2019-11-19 MED ORDER — SODIUM CHLORIDE 0.9 % BOLUS PEDS
250.0000 mL | Freq: Once | INTRAVENOUS | Status: AC
  Administered 2019-11-19: 250 mL via INTRAVENOUS

## 2019-11-19 MED ORDER — BUPIVACAINE-EPINEPHRINE (PF) 0.5% -1:200000 IJ SOLN
INTRAMUSCULAR | Status: DC | PRN
  Administered 2019-11-19: 30 mL

## 2019-11-19 MED ORDER — MIDAZOLAM HCL 2 MG/2ML IJ SOLN
INTRAMUSCULAR | Status: AC
  Filled 2019-11-19: qty 2

## 2019-11-19 MED ORDER — KETOROLAC TROMETHAMINE 15 MG/ML IJ SOLN
INTRAMUSCULAR | Status: AC
  Filled 2019-11-19: qty 1

## 2019-11-19 MED ORDER — CEFAZOLIN SODIUM-DEXTROSE 2-4 GM/100ML-% IV SOLN
INTRAVENOUS | Status: AC
  Filled 2019-11-19: qty 100

## 2019-11-19 MED ORDER — DROPERIDOL 2.5 MG/ML IJ SOLN
0.6250 mg | Freq: Once | INTRAMUSCULAR | Status: DC | PRN
  Filled 2019-11-19: qty 2

## 2019-11-19 MED ORDER — ONDANSETRON HCL 4 MG/2ML IJ SOLN: 4.0000 mg | Freq: Four times a day (QID) | INTRAMUSCULAR | Status: DC | PRN

## 2019-11-19 MED ORDER — KETOROLAC TROMETHAMINE 30 MG/ML IJ SOLN: 30.0000 mg | Freq: Once | INTRAMUSCULAR | Status: AC

## 2019-11-19 MED ORDER — METOCLOPRAMIDE HCL 10 MG PO TABS: 5.0000 mg | ORAL_TABLET | Freq: Three times a day (TID) | ORAL | Status: DC | PRN

## 2019-11-19 MED ORDER — KETOROLAC TROMETHAMINE 15 MG/ML IJ SOLN
15.0000 mg | Freq: Four times a day (QID) | INTRAMUSCULAR | Status: DC
  Administered 2019-11-19: 15 mg via INTRAVENOUS

## 2019-11-19 MED ORDER — HYDROCODONE-ACETAMINOPHEN 7.5-325 MG PO TABS
1.0000 | ORAL_TABLET | Freq: Once | ORAL | Status: AC | PRN
  Administered 2019-11-19: 1 via ORAL

## 2019-11-19 MED ORDER — OXYCODONE HCL 5 MG PO TABS
ORAL_TABLET | ORAL | Status: AC
  Filled 2019-11-19: qty 1

## 2019-11-19 MED ORDER — BUPIVACAINE LIPOSOME 1.3 % IJ SUSP
INTRAMUSCULAR | Status: AC
  Filled 2019-11-19: qty 20

## 2019-11-19 MED ORDER — OXYCODONE HCL 5 MG PO TABS
5.0000 mg | ORAL_TABLET | ORAL | 0 refills | Status: DC | PRN
Start: 2019-11-19 — End: 2020-08-04

## 2019-11-19 MED ORDER — HYDROCODONE-ACETAMINOPHEN 7.5-325 MG PO TABS
ORAL_TABLET | ORAL | Status: AC
  Filled 2019-11-19: qty 1

## 2019-11-19 MED ORDER — ONDANSETRON HCL 4 MG PO TABS: 4.0000 mg | ORAL_TABLET | Freq: Four times a day (QID) | ORAL | Status: DC | PRN

## 2019-11-19 MED ORDER — FENTANYL CITRATE (PF) 100 MCG/2ML IJ SOLN
INTRAMUSCULAR | Status: AC
  Administered 2019-11-19: 25 ug via INTRAVENOUS
  Filled 2019-11-19: qty 2

## 2019-11-19 MED ORDER — SODIUM CHLORIDE 0.9 % BOLUS PEDS: 250.0000 mL | Freq: Once | INTRAVENOUS | Status: DC

## 2019-11-19 MED ORDER — TRANEXAMIC ACID 1000 MG/10ML IV SOLN
INTRAVENOUS | Status: DC | PRN
  Administered 2019-11-19: 1000 mg via TOPICAL

## 2019-11-19 MED ORDER — KETOROLAC TROMETHAMINE 30 MG/ML IJ SOLN
INTRAMUSCULAR | Status: AC
  Administered 2019-11-19: 30 mg via INTRAVENOUS
  Filled 2019-11-19: qty 1

## 2019-11-19 MED ORDER — EPHEDRINE SULFATE 50 MG/ML IJ SOLN
INTRAMUSCULAR | Status: DC | PRN
  Administered 2019-11-19 (×2): 5 mg via INTRAVENOUS

## 2019-11-19 MED ORDER — CEFAZOLIN SODIUM-DEXTROSE 2-4 GM/100ML-% IV SOLN: 2.0000 g | Freq: Four times a day (QID) | INTRAVENOUS | Status: DC

## 2019-11-19 MED ORDER — FENTANYL CITRATE (PF) 100 MCG/2ML IJ SOLN
25.0000 ug | INTRAMUSCULAR | Status: DC | PRN
  Administered 2019-11-19 (×2): 25 ug via INTRAVENOUS

## 2019-11-19 MED ORDER — CHLORHEXIDINE GLUCONATE 0.12 % MT SOLN
OROMUCOSAL | Status: AC
  Administered 2019-11-19: 15 mL via OROMUCOSAL
  Filled 2019-11-19: qty 15

## 2019-11-19 MED ORDER — ACETAMINOPHEN 325 MG PO TABS: 325.0000 mg | ORAL_TABLET | ORAL | Status: DC | PRN

## 2019-11-19 MED ORDER — SODIUM CHLORIDE 0.9 % IV SOLN: INTRAVENOUS | Status: DC

## 2019-11-19 MED ORDER — BUPIVACAINE HCL (PF) 0.5 % IJ SOLN
INTRAMUSCULAR | Status: DC | PRN
  Administered 2019-11-19: 10 mL via PERINEURAL

## 2019-11-19 MED ORDER — TRANEXAMIC ACID 1000 MG/10ML IV SOLN
INTRAVENOUS | Status: AC
  Filled 2019-11-19: qty 10

## 2019-11-19 MED ORDER — PROPOFOL 500 MG/50ML IV EMUL
INTRAVENOUS | Status: DC | PRN
  Administered 2019-11-19: 50 ug/kg/min via INTRAVENOUS

## 2019-11-19 MED ORDER — ACETAMINOPHEN 160 MG/5ML PO SOLN
325.0000 mg | ORAL | Status: DC | PRN
  Filled 2019-11-19: qty 20.3

## 2019-11-19 MED ORDER — SODIUM CHLORIDE 0.9 % IV SOLN
INTRAVENOUS | Status: DC | PRN
  Administered 2019-11-19: 60 mL

## 2019-11-19 MED ORDER — PROMETHAZINE HCL 25 MG/ML IJ SOLN: 6.2500 mg | INTRAMUSCULAR | Status: DC | PRN

## 2019-11-19 MED ORDER — METOCLOPRAMIDE HCL 5 MG/ML IJ SOLN: 5.0000 mg | Freq: Three times a day (TID) | INTRAMUSCULAR | Status: DC | PRN

## 2019-11-19 MED ORDER — CEFAZOLIN SODIUM-DEXTROSE 2-4 GM/100ML-% IV SOLN
INTRAVENOUS | Status: AC
  Administered 2019-11-19: 2 g via INTRAVENOUS
  Filled 2019-11-19: qty 100

## 2019-11-19 MED ORDER — LIDOCAINE HCL (PF) 1 % IJ SOLN
INTRAMUSCULAR | Status: AC
  Filled 2019-11-19: qty 5

## 2019-11-19 MED ORDER — CEFAZOLIN SODIUM-DEXTROSE 2-4 GM/100ML-% IV SOLN
2.0000 g | INTRAVENOUS | Status: AC
  Administered 2019-11-19: 2 g via INTRAVENOUS

## 2019-11-19 MED ORDER — BUPIVACAINE HCL (PF) 0.5 % IJ SOLN
INTRAMUSCULAR | Status: DC | PRN
  Administered 2019-11-19: 3 mL

## 2019-11-19 MED ORDER — FENTANYL CITRATE (PF) 100 MCG/2ML IJ SOLN
INTRAMUSCULAR | Status: DC | PRN
  Administered 2019-11-19 (×2): 50 ug via INTRAVENOUS

## 2019-11-19 MED ORDER — ORAL CARE MOUTH RINSE: 15.0000 mL | Freq: Once | OROMUCOSAL | Status: AC

## 2019-11-19 MED ORDER — LIDOCAINE HCL (PF) 1 % IJ SOLN
INTRAMUSCULAR | Status: DC | PRN
  Administered 2019-11-19: 5 mL

## 2019-11-19 MED ORDER — CHLORHEXIDINE GLUCONATE 0.12 % MT SOLN: 15.0000 mL | Freq: Once | OROMUCOSAL | Status: AC

## 2019-11-19 MED ORDER — SODIUM CHLORIDE FLUSH 0.9 % IV SOLN
INTRAVENOUS | Status: AC
  Filled 2019-11-19: qty 40

## 2019-11-19 MED ORDER — OXYCODONE HCL 5 MG PO TABS
5.0000 mg | ORAL_TABLET | ORAL | Status: DC | PRN
  Administered 2019-11-19: 5 mg via ORAL
  Filled 2019-11-19 (×2): qty 2

## 2019-11-19 MED ORDER — DEXAMETHASONE SODIUM PHOSPHATE 10 MG/ML IJ SOLN
INTRAMUSCULAR | Status: AC
  Filled 2019-11-19: qty 1

## 2019-11-19 MED ORDER — APIXABAN 2.5 MG PO TABS: 2.5000 mg | ORAL_TABLET | Freq: Two times a day (BID) | ORAL | 0 refills | Status: DC

## 2019-11-19 MED ORDER — PHENYLEPHRINE HCL (PRESSORS) 10 MG/ML IV SOLN
INTRAVENOUS | Status: DC | PRN
  Administered 2019-11-19: 100 ug via INTRAVENOUS

## 2019-11-19 MED ORDER — BUPIVACAINE HCL (PF) 0.5 % IJ SOLN
INTRAMUSCULAR | Status: AC
  Filled 2019-11-19: qty 30

## 2019-11-19 SURGICAL SUPPLY — 60 items
APL PRP STRL LF DISP 70% ISPRP (MISCELLANEOUS) ×1
BLADE SAW SAG 25X90X1.19 (BLADE) ×2 IMPLANT
BLADE SURG SZ20 CARB STEEL (BLADE) ×2 IMPLANT
BNDG CMPR STD VLCR NS LF 5.8X6 (GAUZE/BANDAGES/DRESSINGS) ×1
BNDG ELASTIC 6X5.8 VLCR NS LF (GAUZE/BANDAGES/DRESSINGS) ×2 IMPLANT
BONE VNGD MED NAIL (MISCELLANEOUS) ×2 IMPLANT
BRNG TIB 67X10 ANT STAB MDLR (Insert) ×1 IMPLANT
CANISTER SUCT 1200ML W/VALVE (MISCELLANEOUS) IMPLANT
CANISTER SUCT 3000ML PPV (MISCELLANEOUS) IMPLANT
CEMENT BONE R 1X40 (Cement) ×4 IMPLANT
CEMENT VACUUM MIXING SYSTEM (MISCELLANEOUS) ×2 IMPLANT
CHLORAPREP W/TINT 26 (MISCELLANEOUS) ×2 IMPLANT
COOLER POLAR GLACIER W/PUMP (MISCELLANEOUS) ×2 IMPLANT
COVER MAYO STAND REUSABLE (DRAPES) ×2 IMPLANT
COVER WAND RF STERILE (DRAPES) ×2 IMPLANT
CUFF TOURN SGL QUICK 30 (TOURNIQUET CUFF) ×2
CUFF TRNQT CYL 30X4X21-28X (TOURNIQUET CUFF) ×1 IMPLANT
DRAPE 3/4 80X56 (DRAPES) ×2 IMPLANT
DRAPE IMP U-DRAPE 54X76 (DRAPES) ×2 IMPLANT
DRSG MEPILEX SACRM 8.7X9.8 (GAUZE/BANDAGES/DRESSINGS) ×2 IMPLANT
DRSG OPSITE POSTOP 4X10 (GAUZE/BANDAGES/DRESSINGS) ×2 IMPLANT
DRSG OPSITE POSTOP 4X8 (GAUZE/BANDAGES/DRESSINGS) IMPLANT
ELECT REM PT RETURN 9FT ADLT (ELECTROSURGICAL) ×2
ELECTRODE REM PT RTRN 9FT ADLT (ELECTROSURGICAL) ×1 IMPLANT
FEMORAL CR LEFT 65MM (Joint) ×2 IMPLANT
GLOVE BIO SURGEON STRL SZ7.5 (GLOVE) ×8 IMPLANT
GLOVE BIO SURGEON STRL SZ8 (GLOVE) ×8 IMPLANT
GLOVE BIOGEL PI IND STRL 8 (GLOVE) ×1 IMPLANT
GLOVE BIOGEL PI INDICATOR 8 (GLOVE) ×1
GLOVE INDICATOR 8.0 STRL GRN (GLOVE) ×2 IMPLANT
GOWN STRL REUS W/ TWL LRG LVL3 (GOWN DISPOSABLE) ×1 IMPLANT
GOWN STRL REUS W/ TWL XL LVL3 (GOWN DISPOSABLE) ×1 IMPLANT
GOWN STRL REUS W/TWL LRG LVL3 (GOWN DISPOSABLE) ×2
GOWN STRL REUS W/TWL XL LVL3 (GOWN DISPOSABLE) ×2
HOLDER FOLEY CATH W/STRAP (MISCELLANEOUS) IMPLANT
HOOD PEEL AWAY FLYTE STAYCOOL (MISCELLANEOUS) ×8 IMPLANT
INSERT TIB BEARING 67X10 (Insert) ×2 IMPLANT
KIT TURNOVER KIT A (KITS) ×2 IMPLANT
NEEDLE SPNL 20GX3.5 QUINCKE YW (NEEDLE) ×2 IMPLANT
NS IRRIG 1000ML POUR BTL (IV SOLUTION) ×2 IMPLANT
PACK TOTAL KNEE (MISCELLANEOUS) ×2 IMPLANT
PAD WRAPON POLAR KNEE (MISCELLANEOUS) ×1 IMPLANT
PENCIL SMOKE EVACUATOR (MISCELLANEOUS) ×2 IMPLANT
PLATE INTERLOK 6700 (Plate) ×2 IMPLANT
PULSAVAC PLUS IRRIG FAN TIP (DISPOSABLE) ×2
SOL .9 NS 3000ML IRR  AL (IV SOLUTION) ×1
SOL .9 NS 3000ML IRR AL (IV SOLUTION) ×1
SOL .9 NS 3000ML IRR UROMATIC (IV SOLUTION) ×1 IMPLANT
STAPLER SKIN PROX 35W (STAPLE) ×2 IMPLANT
SUCTION FRAZIER HANDLE 10FR (MISCELLANEOUS) ×1
SUCTION TUBE FRAZIER 10FR DISP (MISCELLANEOUS) ×1 IMPLANT
SUT VIC AB 0 CT1 36 (SUTURE) ×6 IMPLANT
SUT VIC AB 2-0 CT1 27 (SUTURE) ×8
SUT VIC AB 2-0 CT1 TAPERPNT 27 (SUTURE) ×4 IMPLANT
SYR 10ML LL (SYRINGE) ×2 IMPLANT
SYR 20ML LL LF (SYRINGE) ×2 IMPLANT
SYR 30ML LL (SYRINGE) IMPLANT
TIP FAN IRRIG PULSAVAC PLUS (DISPOSABLE) ×1 IMPLANT
TRAY FOLEY MTR SLVR 16FR STAT (SET/KITS/TRAYS/PACK) IMPLANT
WRAPON POLAR PAD KNEE (MISCELLANEOUS) ×2

## 2019-11-19 NOTE — Op Note (Signed)
11/19/2019  9:55 AM  Patient:   Nancy Combs  Pre-Op Diagnosis:   Degenerative joint disease, left knee.  Post-Op Diagnosis:   Same  Procedure:   Left TKA using all-cemented Bi[employer] Vanguard system with a 65 mm PCR femur and a 67 mm tibial tray with a 10 mm anterior stabilized E-poly insert.  Surgeon:   Pascal Lux, MD  Assistant:   Cameron Proud, PA-C; Sherlene Shams, PA-S  Anesthesia:   Spinal  Findings:   As above  Complications:   None  EBL:   5 cc  Fluids:   500 cc crystalloid  UOP:   None  TT:   90 minutes at 300 mmHg  Drains:   None  Closure:   Staples  Implants:   As above  Brief Clinical Note:   The patient is a 59 year old female with a long history of progressively worsening left knee pain. The patient's symptoms have progressed despite medications, activity modification, injections, etc. The patient's history and examination were consistent with advanced degenerative joint disease of the left knee confirmed by plain radiographs. The patient presents at this time for a left total knee arthroplasty.  Procedure:   The patient was brought into the operating room. After adequate spinal anesthesia was obtained, the patient was lain in the supine position before the left lower extremity was prepped with ChloraPrep solution and draped sterilely. Preoperative antibiotics were administered. After verifying the proper laterality with a surgical timeout, the limb was exsanguinated with an Esmarch and the tourniquet inflated to 300 mmHg.   A standard anterior approach to the knee was made through an approximately 6-7 inch incision. The incision was carried down through the subcutaneous tissues to expose superficial retinaculum. This was split the length of the incision and the medial flap elevated sufficiently to expose the medial retinaculum. The medial retinaculum was incised, leaving a 3-4 mm cuff of tissue on the patella. This was extended distally along the medial border of  the patellar tendon and proximally through the medial third of the quadriceps tendon. A subtotal fat pad excision was performed before the soft tissues were elevated off the anteromedial and anterolateral aspects of the proximal tibia to the level of the collateral ligaments. The anterior portions of the medial and lateral menisci were removed, as was the anterior cruciate ligament. With the knee flexed to 90, the external tibial guide was positioned and the appropriate proximal tibial cut made. This piece was taken to the back table where it was measured and found to be optimally replicated by a 67 mm component.  Attention was directed to the distal femur. The intramedullary canal was accessed through a 3/8" drill hole. The intramedullary guide was inserted and positioned in order to obtain a neutral flexion gap. The intercondylar block was positioned with care taken to avoid notching the anterior cortex of the femur. The appropriate cut was made. Next, the distal cutting block was placed at 5 of valgus alignment. Using the 9 mm slot, the distal cut was made. The distal femur was measured and found to be optimally replicated by the 65 mm component. The 65 mm 4-in-1 cutting block was positioned and first the posterior, then the posterior chamfer, the anterior chamfer, and finally the anterior cuts were made. At this point, the posterior portions medial and lateral menisci were removed. A trial reduction was performed using the appropriate femoral and tibial components with the 10 mm insert. This demonstrated excellent stability to varus and valgus stressing both in  flexion and extension while permitting full extension. Patella tracking was assessed and found to be excellent. Therefore, the tibial guide position was marked on the proximal tibia. The patella thickness was measured and found to be 19 mm. Given the patient's age and excellent quality of the patellar articular cartilage, it was felt best not to  resurface the patella. Patella tracking was assessed and found to be excellent, passing the "no thumb test". The lug holes were drilled into the distal femur before the trial component was removed, leaving only the tibial tray. The keel was then created using the appropriate tower, reamer, and punch.  The bony surfaces were prepared for cementing by irrigating them thoroughly with sterile saline solution via the jet lavage system. A bone plug was fashioned from some of the bone that had been removed previously and used to plug the distal femoral canal. In addition, 20 cc of Exparel diluted out to 60 cc with normal saline and 30 cc of 0.5% Sensorcaine were injected into the postero-medial and postero-lateral aspects of the knee, the medial and lateral gutter regions, and the peri-incisional tissues to help with postoperative analgesia. Meanwhile, the cement was being mixed on the back table. When it was ready, the tibial tray was cemented in first. The excess cement was removed using [employer] fast streamer. Next, the femoral component was impacted into place. Again, the excess cement was removed using [employer] fast streamer. The 10 mm trial insert was positioned and the knee brought into extension while the cement hardened. Finally, the patella was cemented into place and secured using the patellar clamp. Again, the excess cement was removed using [employer] fast streamer. Once the cement had hardened, the knee was placed through a range of motion with the findings as described above. Therefore, the trial insert was removed and, after verifying that no cement had been retained posteriorly, the permanent 10 mm anterior stabilized E-polyethylene insert was positioned and secured using the appropriate key locking mechanism. Again the knee was placed through a range of motion with the findings as described above.  The wound was copiously irrigated with sterile saline solution using the jet lavage system before the quadriceps tendon and  retinacular layer were reapproximated using #0 Vicryl interrupted sutures. The superficial retinacular layer also was closed using a running #0 Vicryl suture. A total of 10 cc of transexemic acid (TXA) was injected intra-articularly before the subcutaneous tissues were closed in several layers using 2-0 Vicryl interrupted sutures. The skin was closed using staples. A sterile honeycomb dressing was applied to the skin before the leg was wrapped with an Ace wrap to accommodate the Polar Care device. The patient was then awakened and returned to the recovery room in satisfactory condition after tolerating the procedure well.

## 2019-11-19 NOTE — Progress Notes (Signed)
Meal ordered for pt. PT on way to see pt. Dr Roland Rack by to check on pt

## 2019-11-19 NOTE — Evaluation (Signed)
Physical Therapy Evaluation Patient Details Name: Nancy Combs MRN: [id number] DOB: Feb 11, 1960 Today's Date: 11/19/2019   History of Present Illness  59 y/o female s/p L TKA 11/4, PT eval in post-op.  Clinical Impression  Pt initially quite pain focused and hesitant to do a lot with PT, but did eventually come around.  She was able to do most of the typical POD0 exercises, did well with mobility and was able to walk ~50 ft with slow but safe gait.  Pt has ramp to enter home and will have at least one of her children with her 24/7.  Pt will clearly need further PT but did do well enough to be safe at home should POD0 d/c still be the plan.      Follow Up Recommendations Home health PT;Follow surgeon's recommendation for DC plan and follow-up therapies    Equipment Recommendations  Rolling walker with 5" wheels;3in1 (PT)    Recommendations for Other Services       Precautions / Restrictions Precautions Precautions: Fall;Knee Precaution Booklet Issued: Yes (comment) (HEP) Restrictions Weight Bearing Restrictions: Yes RLE Weight Bearing: Weight bearing as tolerated      Mobility  Bed Mobility Overal bed mobility: Needs Assistance Bed Mobility: Supine to Sit     Supine to sit: Min guard     General bed mobility comments: Pt did well, some pain hesitancy but with only light cuing and CGA was able to get to sitting EOB    Transfers Overall transfer level: Modified independent Equipment used: Rolling walker (2 wheeled)             General transfer comment: Pt height vs bed height ratio made getting to standing relatively easy, did not need direct assist, reliant on walker once up and weight shifted forward  Ambulation/Gait Ambulation/Gait assistance: Min guard Gait Distance (Feet): 45 Feet Assistive device: Rolling walker (2 wheeled)       General Gait Details: Pt with slow, deliberate, but overall safe ambulation.  She had only one very small near buckle that she  easily self arrested on the walker.  Pt c/o expected pain, but showed ability to do limited in home distances safely - daughter present and encouraged her to make sure mom had someone present each time she gets up initially 2/2 h/o buckling  Stairs            Wheelchair Mobility    Modified Rankin (Stroke Patients Only)       Balance Overall balance assessment: Needs assistance   Sitting balance-Leahy Scale: Good Sitting balance - Comments: able to maitnain sitting balance relatively well     Standing balance-Leahy Scale: Fair Standing balance comment: reliant on walker, guarded with WBing but no overt safety/balance issues with fuctrional tasks                             Pertinent Vitals/Pain Pain Assessment: 0-10 Pain Score: 8  Pain Location: L knee, specifically incision     Home Living Family/patient expects to be discharged to:: Private residence Living Arrangements: Children Available Help at Discharge: Family;Available 24 hours/day Type of Home: House Home Access: Ramped entrance     Home Layout: One level Home Equipment: Walker - 4 wheels      Prior Function Level of Independence: Independent with assistive device(s)         Comments: recently has started using w/c for anything more than in home distances  Hand Dominance        Extremity/Trunk Assessment   Upper Extremity Assessment Upper Extremity Assessment: Overall WFL for tasks assessed    Lower Extremity Assessment Lower Extremity Assessment: Generalized weakness (expected post-op L LE weakness, R LE grossly 4-/5)       Communication   Communication: No difficulties  Cognition Arousal/Alertness: Awake/alert Behavior During Therapy: WFL for tasks assessed/performed Overall Cognitive Status: Within Functional Limits for tasks assessed                                        General Comments      Exercises Total Joint Exercises Ankle  Circles/Pumps: Strengthening;10 reps Quad Sets: Strengthening;10 reps Short Arc Quad: AAROM;10 reps;Strengthening (AAROM for TKE) Heel Slides: Strengthening;10 reps Hip ABduction/ADduction: Strengthening;10 reps Straight Leg Raises: AAROM;AROM;10 reps (able to do 5 reps w/o direct assist) Knee Flexion: 5 reps;AROM Goniometric ROM: 1-87   Assessment/Plan    PT Assessment Patient needs continued PT services  PT Problem List Decreased strength;Decreased range of motion;Decreased activity tolerance;Decreased balance;Decreased mobility;Decreased knowledge of use of DME;Decreased safety awareness;Decreased knowledge of precautions;Pain       PT Treatment Interventions DME instruction;Gait training;Functional mobility training;Therapeutic activities;Therapeutic exercise;Balance training;Neuromuscular re-education;Patient/family education    PT Goals (Current goals can be found in the Care Plan section)  Acute Rehab PT Goals Patient Stated Goal: go home today PT Goal Formulation: With patient Time For Goal Achievement: 12/03/19 Potential to Achieve Goals: Good    Frequency BID   Barriers to discharge        Co-evaluation               AM-PAC PT "6 Clicks" Mobility  Outcome Measure Help needed turning from your back to your side while in a flat bed without using bedrails?: None Help needed moving from lying on your back to sitting on the side of a flat bed without using bedrails?: None Help needed moving to and from a bed to a chair (including a wheelchair)?: A Little Help needed standing up from a chair using your arms (e.g., wheelchair or bedside chair)?: A Little Help needed to walk in hospital room?: A Little Help needed climbing 3-5 steps with a railing? : A Lot 6 Click Score: 19    End of Session Equipment Utilized During Treatment: Gait belt Activity Tolerance: Patient tolerated treatment well;Patient limited by pain Patient left: with call bell/phone within  reach;with family/visitor present;in chair Nurse Communication: Mobility status PT Visit Diagnosis: Muscle weakness (generalized) (M62.81);Difficulty in walking, not elsewhere classified (R26.2);Pain Pain - Right/Left: Right Pain - part of body: Knee    Time: 1530-1620 PT Time Calculation (min) (ACUTE ONLY): 50 min   Charges:   PT Evaluation $PT Eval Low Complexity: 1 Low PT Treatments $Gait Training: 8-22 mins $Therapeutic Exercise: 8-22 mins        Kreg Shropshire, DPT 11/19/2019, 5:15 PM

## 2019-11-19 NOTE — Progress Notes (Signed)
Pt was given walker and bedside commode.

## 2019-11-19 NOTE — Anesthesia Postprocedure Evaluation (Signed)
Anesthesia Post Note  Patient: [employer]  Procedure(s) Performed: TOTAL KNEE ARTHROPLASTY (Left Knee)  Patient location during evaluation: PACU Anesthesia Type: Spinal Level of consciousness: awake and alert Pain management: pain level controlled Vital Signs Assessment: post-procedure vital signs reviewed and stable Respiratory status: spontaneous breathing and respiratory function stable Cardiovascular status: blood pressure returned to baseline and stable Postop Assessment: spinal receding (Spinal clompletely resolved except mild hip flexor weakness on L) Anesthetic complications: no Comments: Pt with 8-10 pain score, requested adductor canal block for post-op pain control. Done with 0 pain score following block.   No complications documented.   Last Vitals:  Vitals:   11/19/19 1420 11/19/19 1425  BP: 134/81 137/79  Pulse: (!) 57 (!) 58  Resp: 15 16  Temp:    SpO2: 99% 97%    Last Pain:  Vitals:   11/19/19 1425  TempSrc:   PainSc: 0-No pain                 Alphonsus Sias

## 2019-11-19 NOTE — Discharge Instructions (Addendum)
Orthopedic discharge instructions: May shower with intact op-site dressing. Apply ice frequently to knee or use Polar Care. Take Eliquis 2.5 mg BID for two weeks, then resume Plavix and aspirin per routine.    BID = twice daily (12 hrs apart) Take oxycodone as prescribed when needed.  May supplement with ES Tylenol if necessary. May weight-bear as tolerated - use walker for balance and support. Follow-up in 10-14 days or as scheduled.    Bupivacaine Liposomal Suspension for Injection What is this medicine? BUPIVACAINE LIPOSOMAL (bue PIV a kane LIP oh som al) is an anesthetic. It causes loss of feeling in the skin or other tissues. It is used to prevent and to treat pain from some procedures. This medicine may be used for other purposes; ask your health care provider or pharmacist if you have questions. COMMON BRAND NAME(S): EXPAREL What should I tell my health care provider before I take this medicine? They need to know if you have any of these conditions:  G6PD deficiency  heart disease  kidney disease  liver disease  low blood pressure  lung or breathing disease, like asthma  an unusual or allergic reaction to bupivacaine, other medicines, foods, dyes, or preservatives  pregnant or trying to get pregnant  breast-feeding How should I use this medicine? This medicine is for injection into the affected area. It is given by a health care professional in a hospital or clinic setting. Talk to your pediatrician regarding the use of this medicine in children. Special care may be needed. Overdosage: If you think you have taken too much of this medicine contact a poison control center or emergency room at once. NOTE: This medicine is only for you. Do not share this medicine with others. What if I miss a dose? This does not apply. What may interact with this medicine? This medicine may interact with the following medications:  acetaminophen  certain antibiotics like dapsone,  nitrofurantoin, aminosalicylic acid, sulfonamides  certain medicines for seizures like phenobarbital, phenytoin, valproic acid  chloroquine  cyclophosphamide  flutamide  hydroxyurea  ifosfamide  metoclopramide  nitric oxide  nitroglycerin  nitroprusside  nitrous oxide  other local anesthetics like lidocaine, pramoxine, tetracaine  primaquine  quinine  rasburicase  sulfasalazine This list may not describe all possible interactions. Give your health care provider a list of all the medicines, herbs, non-prescription drugs, or dietary supplements you use. Also tell them if you smoke, drink alcohol, or use illegal drugs. Some items may interact with your medicine. What should I watch for while using this medicine? Your condition will be monitored carefully while you are receiving this medicine. Be careful to avoid injury while the area is numb, and you are not aware of pain. What side effects may I notice from receiving this medicine? Side effects that you should report to your doctor or health care professional as soon as possible:  allergic reactions like skin rash, itching or hives, swelling of the face, lips, or tongue  seizures  signs and symptoms of a dangerous change in heartbeat or heart rhythm like chest pain; dizziness; fast, irregular heartbeat; palpitations; feeling faint or lightheaded; falls; breathing problems  signs and symptoms of methemoglobinemia such as pale, gray, or blue colored skin; headache; fast heartbeat; shortness of breath; feeling faint or lightheaded, falls; tiredness Side effects that usually do not require medical attention (report to your doctor or health care professional if they continue or are bothersome):  anxious  back pain  changes in taste  changes in  vision  constipation  dizziness  fever  nausea, vomiting This list may not describe all possible side effects. Call your doctor for medical advice about side effects.  You may report side effects to FDA at 1-800-FDA-1088. Where should I keep my medicine? This drug is given in a hospital or clinic and will not be stored at home. NOTE: This sheet is a summary. It may not cover all possible information. If you have questions about this medicine, talk to your doctor, pharmacist, or health care provider.  2020 Elsevier/Gold Standard (2018-10-14 10:48:23)   AMBULATORY SURGERY  DISCHARGE INSTRUCTIONS   1) The drugs that you were given will stay in your system until tomorrow so for the next 24 hours you should not:  A) Drive an automobile B) Make any legal decisions C) Drink any alcoholic beverage   2) You may resume regular meals tomorrow.  Today it is better to start with liquids and gradually work up to solid foods.  You may eat anything you prefer, but it is better to start with liquids, then soup and crackers, and gradually work up to solid foods.   3) Please notify your doctor immediately if you have any unusual bleeding, trouble breathing, redness and pain at the surgery site, drainage, fever, or pain not relieved by medication.    4) Additional Instructions:        Please contact your physician with any problems or Same Day Surgery at [phone], Monday through Friday 6 am to 4 pm, or [place] at [facility] number at [phone].

## 2019-11-19 NOTE — Anesthesia Preprocedure Evaluation (Addendum)
Anesthesia Evaluation  Patient identified by MRN, date of birth, ID band Patient awake    Reviewed: Allergy & Precautions, H&P , NPO status , reviewed documented beta blocker date and time   Airway Mallampati: III  TM Distance: >3 FB Neck ROM: limited    Dental  (+) Edentulous Upper, Partial Lower, Missing   Pulmonary shortness of breath, COPD, former smoker,    Pulmonary exam normal        Cardiovascular hypertension, + CAD and + Past MI  Normal cardiovascular exam     Neuro/Psych    GI/Hepatic neg GERD  ,  Endo/Other  diabetes  Renal/GU Renal disease     Musculoskeletal  (+) Arthritis ,   Abdominal   Peds  Hematology   Anesthesia Other Findings Past Medical History: No date: CAD (coronary artery disease) No date: Cancer ([place])     Comment:  lung No date: Chronic kidney disease     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  No date: COPD (chronic obstructive pulmonary disease) (HCC) No date: Diabetes mellitus without complication (HCC) No date: Dyspnea No date: Hypertension No date: MI (myocardial infarction) ([place]) No date: PV (polycythemia vera) ([place])  Past Surgical History: No date: CESAREAN SECTION 02/2017: CORONARY ANGIOPLASTY WITH STENT PLACEMENT No date: LUNG LOBECTOMY; Right No date: PERICARDIAL WINDOW 1982: THYROIDECTOMY, PARTIAL     Reproductive/Obstetrics                            Anesthesia Physical Anesthesia Plan  ASA: III  Anesthesia Plan: Spinal   Post-op Pain Management:    Induction: Intravenous  PONV Risk Score and Plan: Treatment may vary due to age or medical condition, TIVA, Ondansetron and Midazolam  Airway Management Planned: Nasal Cannula and Natural Airway  Additional Equipment:   Intra-op Plan:   Post-operative Plan:   Informed Consent: I have reviewed the patients History and Physical, chart, labs and discussed the procedure including the risks,  benefits and alternatives for the proposed anesthesia with the patient or authorized representative who has indicated his/her understanding and acceptance.     Dental Advisory Given  Plan Discussed with: CRNA  Anesthesia Plan Comments: (Off Plavix (and ASA) for 1 week per pt. Confirmed directly with her.)       Anesthesia Quick Evaluation

## 2019-11-19 NOTE — Anesthesia Procedure Notes (Signed)
Spinal  Patient location during procedure: OR Start time: 11/19/2019 7:41 AM Staffing Performed: resident/CRNA  Anesthesiologist: Alphonsus Sias, MD Resident/CRNA: Marcha Licklider, Einar Grad, CRNA Preanesthetic Checklist Completed: patient identified, IV checked, site marked, risks and benefits discussed, surgical consent, monitors and equipment checked, pre-op evaluation and timeout performed Spinal Block Patient position: sitting Prep: DuraPrep Patient monitoring: heart rate, cardiac monitor, continuous pulse ox and blood pressure Approach: midline Location: L3-4 Injection technique: single-shot Needle Needle type: Sprotte  Needle gauge: 24 G Needle length: 9 cm Assessment Sensory level: T4 Additional Notes CSF flow brisk and clear, - heme, - parathesia, pt tolerated procedure well.

## 2019-11-19 NOTE — Anesthesia Procedure Notes (Signed)
Anesthesia Regional Block: Adductor canal block   Pre-Anesthetic Checklist: ,, timeout performed, Correct Patient, Correct Site, Correct Laterality, Correct Procedure, Correct Position, site marked, Risks and benefits discussed,  Surgical consent,  Pre-op evaluation,  At surgeon's request and post-op pain management  Laterality: Left  Prep: chloraprep, alcohol swabs       Needles:  Injection technique: Single-shot  Needle Type: Echogenic Needle     Needle Length: 9cm  Needle Gauge: 21   Needle insertion depth: 7 cm   Additional Needles:   Procedures: Doppler guided,,,, ultrasound used (permanent image in chart),,,,  Narrative:  Start time: 11/19/2019 2:21 PM End time: 11/19/2019 2:24 PM Injection made incrementally with aspirations every 5 mL.  Performed by: Personally  Anesthesiologist: Alphonsus Sias, MD  Additional Notes: Pt post-op from TKR under spinal. Spinal nearly completely gone (mild hip flexor weakness on L, otherwise full sensation). Pt stil with 8-10 pain, requesting ACB. Discussed w surgeon, will proceed. Functioning IV was confirmed and O2 [place]/monitors were applied. Patient responsive throughout. A 112mm 21ga EchoStim needle was used. Sterile prep and drape,hand hygiene and sterile gloves were used.  Negative aspiration and negative test dose prior to incremental administration of local anesthetic. 1% Lidocaine for skin wheal, 3 ml. Total LA: 75ml - 0.5% Bupivicaine 72ml, 1% lido 49ml, 1:200 epi, 8 mg decadron. U/S images stored in chart. The patient tolerated the procedure well, pain level was a few minutes 0 after block.

## 2019-11-19 NOTE — Progress Notes (Signed)
Dr. Lavone Neri at bedside to speak with and assess pt. Pt states "pain is unbearable".  Pt has WNL plantar and dorsiflexion but reports she is unable to raise leg.

## 2019-11-19 NOTE — Transfer of Care (Signed)
Immediate Anesthesia Transfer of Care Note  Patient: [facility]  Procedure(s) Performed: TOTAL KNEE ARTHROPLASTY (Left Knee)  Patient Location: PACU  Anesthesia Type:Spinal  Level of Consciousness: awake, alert  and oriented  Airway & Oxygen Therapy: Patient Spontanous Breathing and Patient connected to nasal cannula oxygen  Post-op Assessment: Report given to RN and Post -op Vital signs reviewed and stable  Post vital signs: Reviewed and stable  Last Vitals:  Vitals Value Taken Time  BP    Temp    Pulse    Resp    SpO2      Last Pain:  Vitals:   11/19/19 0631  TempSrc: Temporal  PainSc: 6          Complications: No complications documented.

## 2019-11-19 NOTE — H&P (Signed)
History of Present Illness: Nancy Combs is a 59 y.o.female who is being referred by Reche Dixon, PA-C, for left knee pain. The symptoms began many years ago but have worsened over the past year. She saw Reche Dixon, PA-C, who tried both the gel shots and steroid shots which provided only temporary relief of her symptoms. X-rays did not appear to show too much arthritis, but an MRI scan has demonstrated complete loss of the articular cartilage involving the medial femoral condyle. Therefore, the patient was referred to me for discussion of a total knee arthroplasty. She reports 5/10 pain on today's visit. The pain is located along the medial aspect of the knee. The pain is described as aching, stabbing and throbbing. The symptoms are aggravated with normal daily activities, with sleeping, using stairs, at higher levels of activity, walking, standing and standing pivot. She also describes occasional episodes of giving way of the knee, but denies any actual locking or catching in the knee. She has associated swelling and no deformity. She has tried acetaminophen, narcotic medications, steroid injections and viscosupplementation injections with limited benefit.  Current Outpatient Medications: . amLODIPine (NORVASC) 10 MG tablet Take 1 tablet (10 mg total) by mouth once daily 30 tablet 11  . blood glucose diagnostic test strip 1 each (1 strip total) by XX route 3 (three) times daily Use as instructed. 100 each 12  . blood glucose meter kit by XX route as directed 1 each 0  . folic acid (FOLVITE) 1 MG tablet Take 1 tablet (1 mg total) by mouth once daily 30 tablet 11  . glipiZIDE (GLUCOTROL) 5 MG tablet Take 1 tablet (5 mg total) by mouth 2 (two) times daily before meals 60 tablet 11  . hydroCHLOROthiazide (HYDRODIURIL) 25 MG tablet Take 1 tablet (25 mg total) by mouth once daily for 360 days 30 tablet 11  . lancets Use 1 each 3 (three) times daily Use as instructed. 100 each 12  . loratadine (CLARITIN) 10 mg  tablet Take 10 mg by mouth once daily  . losartan (COZAAR) 100 MG tablet Take 1 tablet (100 mg total) by mouth once daily 30 tablet 11  . metFORMIN (GLUCOPHAGE-XR) 500 MG XR tablet 1 po in am and 2 po in pm (Patient taking differently: 1 po in am and 2 po in pm( patient states she takes a total 1500 mg daily) ) 270 tablet 4  . metoprolol succinate (TOPROL-XL) 100 MG XL tablet Take 1 tablet (100 mg total) by mouth once daily 30 tablet 11  . simvastatin (ZOCOR) 20 MG tablet Take 1 tablet (20 mg total) by mouth nightly 30 tablet 11  . tiotropium-olodateroL (STIOLTO RESPIMAT) 2.5-2.5 mcg/actuation inhaler Inhale 2 inhalations into the lungs once daily 4 g 2  . traMADoL (ULTRAM) 50 mg tablet Take 1 tablet (50 mg total) by mouth every 6 (six) hours as needed for Pain for up to 30 doses 30 tablet 0  . varenicline (CHANTIX) 1 mg tablet Take one tablet each morning and evening with food 60 tablet 2  . VENTOLIN HFA 90 mcg/actuation inhaler INHALE 2 PUFFS BY MOUTH EVERY 6 HOURS AS NEEDED FOR WHEEZING OR SHORTNESS OF BREATH  . aspirin 81 MG EC tablet Take 81 mg by mouth once daily (Patient not taking: Reported on 11/16/2019 )  . clopidogreL (PLAVIX) 75 mg tablet Take 1 tablet (75 mg total) by mouth once daily (Patient not taking: Reported on 11/16/2019 ) 30 tablet 11  . nicotine polacrilex (NICORETTE) 4 MG gum  Start 4 weeks prior to Quit Day. Chew gum a few times then place between cheek and gum.Use every 15 minutes as needed for urges (Patient not taking: Reported on 11/16/2019 ) 100 each 3  . nicotine polacrilex (NICORETTE) 4 MG lozenge Start 4 weeks prior to Quit Day. Place 1 lozenge between cheek and gum every 15 minutes for urges (Patient not taking: Reported on 11/16/2019 ) 324 lozenge 3  . semaglutide (OZEMPIC) 0.25 mg or 0.5 mg(2 mg/1.5 mL) pen injector Inject 0.1875 mLs (0.25 mg total) subcutaneously once a week for 30 days 0.75 mL 0  . varenicline (CHANTIX) 0.5 MG tablet Take one tablet each morning with  food for 3 days, then one tablet each morning and evening with food for for 4 days, then two tablets each morning and evening with food (Patient not taking: Reported on 11/16/2019 ) 59 tablet 0   Allergies:  . Nicotine Hives and Rash  Localized rash occurred after 2 weeks of patch use, then hives spread from application site to face/neck  . Adhesive Hives   Past Medical History:  . COPD (chronic obstructive pulmonary disease) (CMS-HCC)  . Diabetes mellitus without complication (CMS-HCC)  prediabetes  . History of abnormal cervical Pap smear  2019. With HPV  . History of myocardial infarction  . Hyperlipidemia  . Hypertension  . PV (polycythemia vera) (CMS-HCC)  . Thyroid disease  Left nodule removed at age 18.   Past Surgical History:  . cardiac stent  . CESAREAN SECTION  . CREATION PERICARDIAL WINDOW  . THORACOTOMY W/BIOPSY LUNG  . THYROIDECTOMY TOTAL Left   Family History:  . Lung cancer Maternal Aunt  . Aneurysm Maternal Aunt  . Lung cancer Maternal Grandmother  . High blood pressure (Hypertension) Mother  . Diabetes type II Mother   Social History:   Socioeconomic History:  Marland Kitchen Marital status: Divorced  Spouse name: Not on file  . Number of children: 3  . Years of education: Not on file  . Highest education level: Not on file  Occupational History  . Occupation: employed at an accounts payable office  Tobacco Use  . Smoking status: Former Smoker  Packs/day: 1.00  Years: 45.00  Pack years: 45.00  Types: Cigarettes  Quit date: 08/18/2019  Years since quitting: 0.2  . Smokeless tobacco: Never Used  Vaping Use  . Vaping Use: Never used  Substance and Sexual Activity  . Alcohol use: Not Currently  . Drug use: Not Currently  Comment: no drugs since she was a teenager  . Sexual activity: Not on file  Other Topics Concern  . Would you please tell us about the people who live in your home, your pets, or anything else important to your social life? Not Asked  Social  History Narrative  . Not on file   Social Determinants of Health:   Financial Resource Strain: Not on file  Food Insecurity: Not on file  Transportation Needs: Not on file   Review of Systems:  A comprehensive 14 point ROS was performed, reviewed, and the pertinent orthopaedic findings are documented in the HPI.  Physical Exam: Vitals:  11/16/19 1508  BP: (!) 164/93  Pulse: 72  Weight: (!) 101.6 kg (224 lb)  Height: 157.5 cm (_0 )  PainSc: 8  PainLoc: Knee   General/Constitutional: Pleasant significantly overweight middle-aged female in no acute distress. Neuro/Psych: Normal mood and affect, oriented to person, place and time. Eyes: Non-icteric. Pupils are equal, round, and reactive to light, and exhibit synchronous movement.  Lymphatic: No palpable adenopathy. Respiratory: Mild bilateral wheezing with no rhonchi. Normal expansion bilaterally. Cardiovascular: Regular rate and rhythm. No murmurs. and No edema, swelling or tenderness, except as noted in detailed exam. Vascular: No edema, swelling or tenderness, except as noted in detailed exam. Integumentary: No impressive skin lesions present, except as noted in detailed exam. Musculoskeletal: Unremarkable, except as noted in detailed exam.  Left knee exam: GAIT: Her gait is not assessed on today's visit as she presents in a wheelchair. ALIGNMENT: normal SKIN: unremarkable SWELLING: mild EFFUSION: trace WARMTH: no warmth TENDERNESS: moderate tenderness over the medial joint line, mild tenderness along lateral joint line ROM: 0 to 120 degrees McMURRAY'S: equivocal PATELLOFEMORAL: normal tracking with no peri-patellar tenderness and negative apprehension sign CREPITUS: no LACHMAN'S: negative PIVOT SHIFT: negative ANTERIOR DRAWER: negative POSTERIOR DRAWER: negative VARUS/VALGUS: stable  She is neurovascularly intact to the left lower extremity and foot.  Knee Imaging: Recent AP weightbearing of both knees, as well  as lateral and merchant views of the left knee are available for review. These films demonstrate moderate degenerative changes, primarily involving the medial compartment with 30% medial joint space narrowing. Overall alignment is neutral. No fractures, lytic lesions, or abnormal calcifications are noted.  Knee Imaging, external: Left knee: By report, a recent MRI scan of the left knee demonstrates evidence of "large areas of full-thickness chondral loss" involving "the weightbearing surface of the medial femoral condyle" mild degenerative changes of the lateral patellofemoral compartments are noted. There is evidence of a possible small lateral meniscus tear involving the inferior portion of the meniscus, but the medial meniscus appears to be in satisfactory condition, as do the anterior and posterior cruciate ligaments. This report was reviewed by myself and discussed with the patient and her daughter.  Assessment:  . Primary osteoarthritis of left knee . Morbid obesity with BMI of 40.0-44.9, adult (CMS-HCC)   Plan: The treatment options were discussed with the patient. In addition, patient educational materials were provided regarding the diagnosis and treatment options. The patient is quite frustrated by her symptoms and function limitations, and is ready to consider more aggressive treatment options. Therefore, I have recommended a surgical seizure, specifically a left total knee arthroplasty. The procedure was discussed with the patient, as were the potential risks (including bleeding, infection, nerve and/or blood vessel injury, persistent or recurrent pain, loosening and/or failure of the components, dislocation, need for further surgery, blood clots, strokes, heart attacks and/or arhythmias, pneumonia, etc.) and benefits. The patient states his/her understanding and wishes to proceed. All of the patient's questions and concerns were answered. She can call any time with further concerns. She will  follow up post-surgery, routine.   H&P reviewed and patient re-examined. No changes.

## 2019-11-20 ENCOUNTER — Encounter: Payer: Self-pay | Admitting: Surgery

## 2019-12-29 ENCOUNTER — Ambulatory Visit (INDEPENDENT_AMBULATORY_CARE_PROVIDER_SITE_OTHER): Payer: BC Managed Care – PPO

## 2019-12-29 ENCOUNTER — Encounter: Payer: Self-pay | Admitting: Emergency Medicine

## 2019-12-29 ENCOUNTER — Other Ambulatory Visit: Payer: Self-pay

## 2019-12-29 ENCOUNTER — Ambulatory Visit
Admission: EM | Admit: 2019-12-29 | Discharge: 2019-12-29 | Disposition: A | Discharge status: Home / Self Care | Payer: BC Managed Care – PPO | Attending: Emergency Medicine | Admitting: Emergency Medicine

## 2019-12-29 DIAGNOSIS — W19XXXA Unspecified fall, initial encounter: Secondary | ICD-10-CM

## 2019-12-29 DIAGNOSIS — M25562 Pain in left knee: Secondary | ICD-10-CM | POA: Diagnosis not present

## 2019-12-29 DIAGNOSIS — S8002XA Contusion of left knee, initial encounter: Secondary | ICD-10-CM | POA: Diagnosis not present

## 2019-12-29 DIAGNOSIS — Z96652 Presence of left artificial knee joint: Secondary | ICD-10-CM

## 2019-12-29 NOTE — ED Provider Notes (Signed)
[facility]    CSN: [id number] Arrival date & time: 12/29/19  0539      History   Chief Complaint Chief Complaint  Patient presents with  . Knee Pain    HPI Nancy Combs is a 59 y.o. female.   HPI   59 year old female here for evaluation of left knee pain.  Patient had a left total knee replacement on November 19, 2019.  Patient reports that she was trying to get into her wheelchair.  Patient reports that she was squatting when the wheelchair slid out from behind her, she feels like all of her weight was on her left knee, and she fell onto her bottom forcefully flexing her left knee.  Patient reports that she is having pain in the front part of her knee just below her kneecap in her shin.  Patient complains of pain that shoots all the way up and down her leg when she tries to extend or sits for a long time.  Patient can bear weight but with great difficulty using her walker.  Past Medical History:  Diagnosis Date  . CAD (coronary artery disease)   . Cancer ([place])    lung  . 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 COPD (chronic obstructive pulmonary disease) ([place])   . Diabetes mellitus without complication ([place])   . Dyspnea   . Hypertension   . MI (myocardial infarction) ([place])   . PV (polycythemia vera) (HCC)     There are no problems to display for this patient.   Past Surgical History:  Procedure Laterality Date  . CESAREAN SECTION    . CORONARY ANGIOPLASTY WITH STENT PLACEMENT  02/2017  . LUNG LOBECTOMY Right   . PERICARDIAL WINDOW    . THYROIDECTOMY, PARTIAL  1982  . TOTAL KNEE ARTHROPLASTY Left 11/19/2019   Procedure: TOTAL KNEE ARTHROPLASTY;  Surgeon: Corky Mull, MD;  Location: [facility] ORS;  Service: Orthopedics;  Laterality: Left;    OB History   No obstetric history on file.      Home Medications    Prior to Admission medications   Medication Sig Start Date End Date Taking? Authorizing Provider  albuterol (PROVENTIL  HFA;VENTOLIN HFA) 108 (90 Base) MCG/ACT inhaler Inhale 2 puffs into the lungs every 6 (six) hours as needed for wheezing or shortness of breath. 11/13/17  Yes Amyot, Nicholes Stairs, NP  amLODipine (NORVASC) 10 MG tablet Take 1 tablet (10 mg total) by mouth daily. 11/13/17  Yes Amyot, Nicholes Stairs, NP  apixaban (ELIQUIS) 2.5 MG TABS tablet Take 1 tablet (2.5 mg total) by mouth 2 (two) times daily. 11/19/19  Yes Poggi, Marshall Cork, MD  folic acid (FOLVITE) 1 MG tablet Take 1 mg by mouth daily.   Yes [provider]  glipiZIDE (GLUCOTROL) 5 MG tablet Take 5 mg by mouth 2 (two) times daily at 8 am and 10 pm. 10/21/19  Yes [provider]  hydrochlorothiazide (HYDRODIURIL) 25 MG tablet Take 25 mg by mouth daily.   Yes [provider]  loratadine (CLARITIN) 10 MG tablet Take 10 mg by mouth daily.   Yes [provider]  losartan (COZAAR) 100 MG tablet Take 1 tablet (100 mg total) by mouth daily. 11/13/17  Yes Amyot, Nicholes Stairs, NP  metFORMIN (GLUCOPHAGE-XR) 500 MG 24 hr tablet Take 500-1,000 mg by mouth See admin instructions. Take 500 mg by mouth in the morning and 1000 mg in the evening 09/29/19  Yes [provider]  metoprolol succinate (TOPROL-XL) 100 MG 24 hr tablet Take 1 tablet (100 mg total) by mouth daily. Take with or immediately following a meal. 11/13/17  Yes Amyot, Nicholes Stairs, NP  oxyCODONE (ROXICODONE) 5 MG immediate release tablet Take 1-2 tablets (5-10 mg total) by mouth every 4 (four) hours as needed for moderate pain or severe pain. 11/19/19  Yes Poggi, Marshall Cork, MD  simvastatin (ZOCOR) 20 MG tablet Take 1 tablet (20 mg total) by mouth daily. 11/13/17  Yes Amyot, Nicholes Stairs, NP  Tiotropium Bromide-Olodaterol (STIOLTO RESPIMAT) 2.5-2.5 MCG/ACT AERS Inhale 2 puffs into the lungs daily. 11/13/17  Yes Amyot, Nicholes Stairs, NP  oxyCODONE (ROXICODONE) 5 MG immediate release tablet Take 1-2 tablets (5-10 mg total) by mouth every 4 (four) hours as needed for moderate pain or severe  pain. 11/19/19   Poggi, Marshall Cork, MD    Family History Family History  Problem Relation Age of Onset  . Diabetes Mother   . Hypertension Mother     Social History Social History   Tobacco Use  . Smoking status: Former Smoker    Packs/day: 0.50    Types: Cigarettes    Quit date: 08/26/2019    Years since quitting: 0.3  . Smokeless tobacco: Never Used  Vaping Use  . Vaping Use: Never used  Substance Use Topics  . Alcohol use: Never  . Drug use: Never     Allergies   Patient has no known allergies.   Review of Systems Review of Systems  Constitutional: Negative for activity change and appetite change.  Musculoskeletal: Positive for arthralgias. Negative for joint swelling.  Skin: Positive for wound. Negative for color change.  Neurological: Negative for numbness.     Physical Exam Triage Vital Signs ED Triage Vitals  Enc Vitals Group     BP      Pulse      Resp      Temp      Temp src      SpO2      Weight      Height      Head Circumference      Peak Flow      Pain Score      Pain Loc      Pain Edu?      Excl. in [place]?    No data found.  Updated Vital Signs BP (!) 136/108 (BP Location: Right Arm)   Pulse 73   Temp 98 F (36.7 C) (Oral)   Resp 18   Ht 5\' 2"  (1.575 m)   Wt 216 lb (98 kg)   SpO2 98%   BMI 39.51 kg/m   Visual Acuity Right Eye Distance:   Left Eye Distance:   Bilateral Distance:    Right Eye Near:   Left Eye Near:    Bilateral Near:     Physical Exam Vitals and nursing note reviewed.  Constitutional:      General: She is not in acute distress.    Appearance: Normal appearance.  HENT:     Head: Normocephalic and atraumatic.  Eyes:     General: No scleral icterus.    Extraocular Movements: Extraocular movements intact.     Conjunctiva/sclera: Conjunctivae normal.     Pupils: Pupils are equal, round, and reactive to light.  Cardiovascular:     Rate and Rhythm: Normal rate and regular rhythm.     Pulses: Normal pulses.      Heart sounds: Normal heart sounds. No murmur heard. No gallop.  Pulmonary:     Effort: Pulmonary effort is normal.     Breath sounds: Normal breath sounds. No wheezing, rhonchi or rales.  Musculoskeletal:        General: Tenderness present. No swelling.  Skin:    General: Skin is warm and dry.     Capillary Refill: Capillary refill takes less than 2 seconds.     Findings: No bruising.  Neurological:     General: No focal deficit present.     Mental Status: She is alert and oriented to person, place, and time.  Psychiatric:        Mood and Affect: Mood normal.        Behavior: Behavior normal.        Thought Content: Thought content normal.        Judgment: Judgment normal.      UC Treatments / Results  Labs (all labs ordered are listed, but only abnormal results are displayed) Labs Reviewed - No data to display  EKG   Radiology DG Knee Complete 4 Views Left  Result Date: 12/29/2019 CLINICAL DATA:  Fall on Sunday.  History of knee replacement EXAM: LEFT KNEE - COMPLETE 4+ VIEW COMPARISON:  None. FINDINGS: Total knee arthroplasty. No fracture or dislocation. No joint effusion identified. IMPRESSION: No fracture or dislocation.  No joint effusion Electronically Signed   By: Suzy Bouchard M.D.   On: 12/29/2019 09:47    Procedures Procedures (including critical care time)  Medications Ordered in UC Medications - No data to display  Initial Impression / Assessment and Plan / UC Course  I have reviewed the triage vital signs and the nursing notes.  Pertinent labs & imaging results that were available during my care of the patient were reviewed by me and considered in my medical decision making (see chart for details).   Patient is here for evaluation of left knee pain status post forceful flexion when she fell 2 days ago.  Patient is 5 weeks status post left total knee replacement.  Patient is currently in outpatient physical therapy for rehab.  Patient is  complaining of pain and has tenderness over the tibial tuberosity.  Patient also has tenderness to the medial joint line.  No effusion noted.  Unable to get patient to full extension due to pain.  There is no ecchymosis.  The surgical incision is well-healed with no wound dehiscence on the anterior aspect of the knee.  Will send for radiographs to determine fracture or hardware derangement.  Radiology read of left knee film is negative for fracture or dislocation.  There is no derangement of the knee hardware either.  Will DC patient with diagnosis of of left knee contusion and have her follow-up with orthopedics and continue her home PT.   Final Clinical Impressions(s) / UC Diagnoses   Final diagnoses:  Contusion of left knee, initial encounter     Discharge Instructions     Your x-rays did not show any bony injury or displacement of your knee prosthesis.  You should be able to continue physical therapy as previously scheduled.  I will make a follow-up appointment to see your orthopedist if your symptoms continue.  You also need to reach out to orthopedics for pain control if over-the-counter Tylenol is not helping.    ED Prescriptions    None     I have reviewed the PDMP during this encounter.   Margarette Canada, NP 12/29/19 [phone]

## 2019-12-29 NOTE — Discharge Instructions (Addendum)
Your x-rays did not show any bony injury or displacement of your knee prosthesis.  You should be able to continue physical therapy as previously scheduled.  I will make a follow-up appointment to see your orthopedist if your symptoms continue.  You also need to reach out to orthopedics for pain control if over-the-counter Tylenol is not helping.

## 2019-12-29 NOTE — ED Triage Notes (Signed)
Patient states she fell Sunday evening injuring her left knee. She just had a knee replacement on Nov 4th. Ortho doctor told them to come to urgent care to be seen.

## 2020-03-06 LAB — COLOGUARD: COLOGUARD: NEGATIVE

## 2020-07-20 ENCOUNTER — Other Ambulatory Visit: Payer: Self-pay | Admitting: Surgery

## 2020-07-26 ENCOUNTER — Encounter
Admission: RE | Admit: 2020-07-26 | Discharge: 2020-07-26 | Disposition: A | Discharge status: Home / Self Care | Payer: BC Managed Care – PPO | Source: Ambulatory Visit | Attending: Surgery | Admitting: Surgery

## 2020-07-26 ENCOUNTER — Other Ambulatory Visit: Payer: Self-pay

## 2020-07-26 DIAGNOSIS — Z01812 Encounter for preprocedural laboratory examination: Secondary | ICD-10-CM | POA: Diagnosis present

## 2020-07-26 HISTORY — DX: Hyperlipidemia, unspecified: E78.5

## 2020-07-26 LAB — CBC WITH DIFFERENTIAL/PLATELET
Abs Immature Granulocytes: 0.11 10*3/uL — ABNORMAL HIGH (ref 0.00–0.07)
Basophils Absolute: 0.1 10*3/uL (ref 0.0–0.1)
Basophils Relative: 1 %
Eosinophils Absolute: 0.3 10*3/uL (ref 0.0–0.5)
Eosinophils Relative: 3 %
HCT: 43.2 % (ref 36.0–46.0)
Hemoglobin: 14.7 g/dL (ref 12.0–15.0)
Immature Granulocytes: 1 %
Lymphocytes Relative: 18 %
Lymphs Abs: 1.8 10*3/uL (ref 0.7–4.0)
MCH: 29.9 pg (ref 26.0–34.0)
MCHC: 34 g/dL (ref 30.0–36.0)
MCV: 88 fL (ref 80.0–100.0)
Monocytes Absolute: 0.5 10*3/uL (ref 0.1–1.0)
Monocytes Relative: 5 %
Neutro Abs: 7.3 10*3/uL (ref 1.7–7.7)
Neutrophils Relative %: 72 %
Platelets: 288 10*3/uL (ref 150–400)
RBC: 4.91 MIL/uL (ref 3.87–5.11)
RDW: 12.7 % (ref 11.5–15.5)
WBC: 10.2 10*3/uL (ref 4.0–10.5)
nRBC: 0 % (ref 0.0–0.2)

## 2020-07-26 LAB — TYPE AND SCREEN
ABO/RH(D): O POS
Antibody Screen: NEGATIVE

## 2020-07-26 LAB — URINALYSIS, ROUTINE W REFLEX MICROSCOPIC
Bilirubin Urine: NEGATIVE
Glucose, UA: >=500 mg/dL — AB
Hgb urine dipstick: NEGATIVE
Ketones, ur: NEGATIVE mg/dL
Leukocytes,Ua: NEGATIVE
Nitrite: NEGATIVE
Protein, ur: 100 mg/dL — AB
Specific Gravity, Urine: 1.021 (ref 1.005–1.030)
pH: 5 (ref 5.0–8.0)

## 2020-07-26 LAB — SURGICAL PCR SCREEN
MRSA, PCR: NEGATIVE
Staphylococcus aureus: NEGATIVE

## 2020-07-26 LAB — COMPREHENSIVE METABOLIC PANEL
ALT: 21 U/L (ref 0–44)
AST: 20 U/L (ref 15–41)
Albumin: 3.6 g/dL (ref 3.5–5.0)
Alkaline Phosphatase: 93 U/L (ref 38–126)
Anion gap: 9 (ref 5–15)
BUN: 18 mg/dL (ref 6–20)
CO2: 26 mmol/L (ref 22–32)
Calcium: 9.2 mg/dL (ref 8.9–10.3)
Chloride: 101 mmol/L (ref 98–111)
Creatinine, Ser: 0.71 mg/dL (ref 0.44–1.00)
GFR, Estimated: >60 mL/min (ref >60)
Glucose, Bld: 232 mg/dL — ABNORMAL HIGH (ref 70–99)
Potassium: 4 mmol/L (ref 3.5–5.1)
Sodium: 136 mmol/L (ref 135–145)
Total Bilirubin: 0.7 mg/dL (ref 0.3–1.2)
Total Protein: 7.1 g/dL (ref 6.5–8.1)

## 2020-07-26 NOTE — Patient Instructions (Addendum)
Your procedure is scheduled on: August 04, 2020 THURSDAY Report to the Registration Desk on the 1st floor of the [facility]. To find out your arrival time, please call [phone] between 1PM - 3PM on: Wednesday August 03, 2020  REMEMBER: Instructions that are not followed completely may result in serious medical risk, up to and including death; or upon the discretion of your surgeon and anesthesiologist your surgery may need to be rescheduled.  Do not eat food after midnight the night before surgery.  No gum chewing, lozengers or hard candies.  You may however, drink CLEAR liquids up to 2 hours before you are scheduled to arrive for your surgery. Do not drink anything within 2 hours of your scheduled arrival time.  Clear liquids include: - water   Do NOT drink anything that is not on this list.  Type 1 and Type 2 diabetics should only drink water.  In addition, your doctor has ordered for you to drink the provided  Gatorade G2 Drinking this carbohydrate drink up to two hours before surgery helps to reduce insulin resistance and improve patient outcomes. Please complete drinking 2 hours prior to scheduled arrival time.  TAKE THESE MEDICATIONS THE MORNING OF SURGERY WITH A SIP OF WATER: AMLODIPINE  Use inhalers on the day of surgery   Stop Metformin 2 days prior to surgery. LAST DOSE August 01, 2020 MONDAY  LAST DOSE OF CLOPIDOGREL IS 07/29/2020 Friday. LAST DOSE OF ASPIRIN IS 07/27/2020 WEDNESDAY  One week prior to surgery: Stop Anti-inflammatories (NSAIDS) such as Advil, Aleve, Ibuprofen, Motrin, Naproxen, Naprosyn and Aspirin based products such as Excedrin, Goodys Powder, BC Powder. Stop ANY OVER THE COUNTER supplements until after surgery. You may however, continue to take Tylenol if needed for pain up until the day of surgery.  No Alcohol for 24 hours before or after surgery.  No Smoking including e-cigarettes for 24 hours prior to surgery.  No chewable tobacco products for  at least 6 hours prior to surgery.  No nicotine patches on the day of surgery.  Do not use any "recreational" drugs for at least a week prior to your surgery.  Please be advised that the combination of cocaine and anesthesia may have negative outcomes, up to and including death. If you test positive for cocaine, your surgery will be cancelled.  On the morning of surgery brush your teeth with toothpaste and water, you may rinse your mouth with mouthwash if you wish. Do not swallow any toothpaste or mouthwash.  Do not wear jewelry, make-up, hairpins, clips or nail polish.  Do not wear lotions, powders, or perfumes.   Do not shave body from the neck down 48 hours prior to surgery just in case you cut yourself which could leave a site for infection.  Also, freshly shaved skin may become irritated if using the CHG soap.  Contact lenses, hearing aids and dentures may not be worn into surgery.  Do not bring valuables to the hospital. [facility] is not responsible for any missing/lost belongings or valuables.   Use CHG Soap  as directed on instruction sheet.  Notify your doctor if there is any change in your medical condition (cold, fever, infection).  Wear comfortable clothing (specific to your surgery type) to the hospital.  After surgery, you can help prevent lung complications by doing breathing exercises.  Take deep breaths and cough every 1-2 hours. Your doctor may order a device called an Incentive Spirometer to help you take deep breaths. When coughing or  sneezing, hold a pillow firmly against your incision with both hands. This is called "splinting." Doing this helps protect your incision. It also decreases belly discomfort.  If you are being admitted to the hospital overnight, YOU MAY [place].   If you are being discharged the day of surgery, you will not be allowed to drive home. You will need a responsible adult (18 years or older) to drive  you home and stay with you that night.   If you are taking public transportation, you will need to have a responsible adult (18 years or older) with you. Please confirm with your physician that it is acceptable to use public transportation.   Please call the [place] Dept. at [phone] if you have any questions about these instructions.  Surgery Visitation Policy:  Patients undergoing a surgery or procedure may have one family member or support person with them as long as that person is not COVID-19 positive or experiencing its symptoms.  That person may remain in the waiting area during the procedure.  Inpatient Visitation:    Visiting hours are 7 a.m. to 8 p.m. Inpatients will be allowed two visitors daily. The visitors may change each day during the patient's stay. No visitors under the age of 28. Any visitor under the age of 42 must be accompanied by an adult. The visitor must pass COVID-19 screenings, use hand sanitizer when entering and exiting the patient's room and wear a mask at all times, including in the patient's room. Patients must also wear a mask when staff or their visitor are in the room. Masking is required regardless of vaccination status.

## 2020-08-01 ENCOUNTER — Encounter: Payer: Self-pay | Admitting: Surgery

## 2020-08-01 NOTE — Progress Notes (Signed)
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 Clinical nursing staff has updated patient's PMH/PSHx, current medication list, and drug allergies/intolerances to ensure comprehensive history available to assist in medical decision making as it pertains to the aforementioned surgical procedure and anticipated anesthetic course. Extensive review of available clinical information performed. [place] PMH and PSHx updated with any diagnoses/procedures that  may have been inadvertently omitted during her intake with the pre-admission testing department's nursing staff.  Clinical Discussion:  Nancy Combs is a 60 y.o. female who is submitted for pre-surgical anesthesia review and clearance prior to her undergoing the above procedure. Patient is a Former Smoker (50 pack years; quit 08/2019). Pertinent PMH includes: CAD, MI, HTN, HLD, T2DM, polycythemia rubra vera, COPD, CKD, lung cancer  Patient is followed by cardiology Saralyn Pilar, MD). She was last seen in the cardiology clinic on 05/11/2019; notes reviewed.  At the time of clinic visit, patient complained of episodic left-sided sharp chest pain and exertional dyspnea. Smoking cessation was encouraged.  Patient is followed by oncology at [facility]; last visit 10/13/2019.  PMH significant for cardiovascular diagnoses.  Patient status post PTCA in 2015.    Patient  suffered an MI in 02/2017. She underwent cardiac catheterization on 02/17/2017 resulting in placement of a DES x 1 to her RCA.    Patient reporting a pericardial window in 2005; no notes available regarding this procedure.  Myocardial perfusion imaging study performed on 04/30/2020 revealed a moderate anterior apical scar without significant ischemia; LVEF 55%.    TTE performed on 05/03/2020 revealed normal LV systolic function with an LVEF of >55%.  There was trivial tricuspid, mitral, and pulmonary valve regurgitation.  Additionally, myocardial walls demonstrated some mild thickening consistent with LVH.  (See full interpretation of cardiovascular testing below).  Patient remained on daily chronic DAPT therapy (ASA + clopidogrel); compliant with therapy with no evidence of GI bleeding patient on GDMT for her HTN and HLD diagnoses.  Blood pressure elevated at 152/60 despite prescribed ARB, beta-blocker, calcium channel blocker, and thiazide diuretic therapies.  She is on a statin for HLD.  T2DM well controlled on multi agent therapy; last A1c was 6.3% on 02/09/2020. Functional capacity, as defined by DASI, is documented as being >/= 4 METS.  No changes were made to patient's medication regime. Patient scheduled to follow-up with outpatient cardiology in 4 months or sooner if needed.  Patient is scheduled for exchange of polyethylene in her LEFT knee on 08/04/2020 with Dr. Milagros Evener, MD.  Given patient's past medical history significant for cardiovascular diagnoses, presurgical cardiac clearance was sought by the PAT team. Per cardiology, "this patient is optimized for surgery and may proceed with the planned procedural course with a LOW risk stratification".  Again, this patient is on daily antiplatelet therapy. She has been instructed on recommendations for holding her daily low dose ASA for 7 days (last dose  07/27/2020) and her clopidogrel for 5 days (last dose 07/29/2020) prior to her procedure with  plans to restart as soon as postoperative bleeding risk felt to be minimized by her attending surgeon.   Patient denies previous perioperative complications with anesthesia in the past. In review of the available records, it is noted that patient underwent a neuraxial anesthetic course here (ASA III) in 11/2019 without documented complications.   Vitals with BMI 07/26/2020 12/29/2019 11/19/2019  Height 5\' 2"  5\' 2"  -  Weight 236 lbs 2 oz 216 lbs -  BMI 56.97 94.8 -  Systolic 016 553 748  Diastolic 67 270 81  Pulse 65 73 63    Providers/Specialists:   NOTE: Primary physician provider listed below. Patient may have been seen by APP or partner within same practice.   PROVIDER ROLE / SPECIALTY LAST OV  Poggi, Marshall Cork, MD Orthopedics (Surgeon) 07/08/2020  Hortencia Pilar, MD Primary Care Provider 02/09/2020  Isaias Cowman, MD Cardiology 05/11/2019   Allergies:  Patient has no known allergies.  Current Home Medications:   No current facility-administered medications for this encounter.    acetaminophen (TYLENOL) 325 MG tablet   amLODipine (NORVASC) 10 MG tablet   aspirin EC 81 MG tablet   clopidogrel (PLAVIX) 75 MG tablet   folic acid (FOLVITE) 1 MG tablet   glipiZIDE (GLUCOTROL) 5 MG tablet   hydrochlorothiazide (HYDRODIURIL) 25 MG tablet   losartan (COZAAR) 100 MG tablet   metFORMIN (GLUCOPHAGE-XR) 500 MG 24 hr tablet   metoprolol succinate (TOPROL-XL) 100 MG 24 hr tablet   simvastatin (ZOCOR) 20 MG tablet   Tiotropium Bromide-Olodaterol (STIOLTO RESPIMAT) 2.5-2.5 MCG/ACT AERS   varenicline (CHANTIX) 1 MG tablet   albuterol (PROVENTIL HFA;VENTOLIN HFA) 108 (90 Base) MCG/ACT inhaler   apixaban (ELIQUIS) 2.5 MG TABS tablet   oxyCODONE (ROXICODONE) 5 MG immediate release tablet   oxyCODONE (ROXICODONE) 5 MG immediate release tablet   History:   Past Medical History:  Diagnosis Date   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   CAD (coronary artery disease)     Chronic anticoagulation    DAPT (ASA + clopidogrel)   Chronic kidney disease    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    COPD (chronic obstructive pulmonary disease) (HCC)    Diabetes mellitus without complication (HCC)    Dyspnea    HLD (hyperlipidemia)    Hyperlipidemia    Hypertension    MI (myocardial infarction) ([place]) 02/2017   PCI with DES x 1 to RCA   PV (polycythemia vera) ([place])    Past Surgical History:  Procedure Laterality Date   CESAREAN SECTION     CORONARY ANGIOPLASTY WITH STENT PLACEMENT  02/17/2017   4.0 x 18 mm Xience DES to RCA   LUNG LOBECTOMY Right 2016   PERICARDIAL WINDOW  2005   THYROIDECTOMY, PARTIAL  1982   TOTAL KNEE ARTHROPLASTY Left 11/19/2019   Procedure: TOTAL KNEE ARTHROPLASTY;  Surgeon: Corky Mull, MD;  Location: [facility] ORS;  Service: Orthopedics;  Laterality: Left;   Family History  Problem Relation Age of Onset   Diabetes Mother    Hypertension Mother    Social History   Tobacco Use   Smoking status: Former    Packs/day: 0.50    Types: Cigarettes    Quit date: 08/26/2019    Years since quitting: 0.9   Smokeless tobacco: Never  Vaping Use   Vaping Use: Never used  Substance Use Topics   Alcohol use: Never   Drug use: Never  Pertinent Clinical Results:  LABS: Labs reviewed: Acceptable for surgery.  No visits with results within 3 Day(s) from this visit.  Latest known visit with results is:  Hospital Outpatient Visit on 07/26/2020  Component Date Value Ref Range Status   MRSA, PCR 07/26/2020 NEGATIVE  NEGATIVE Final   Staphylococcus aureus 07/26/2020 NEGATIVE  NEGATIVE Final   Comment: (NOTE) The Xpert SA Assay (FDA approved for NASAL specimens in patients 43 years of age and older), is one component of a comprehensive surveillance program. It is not intended to diagnose infection nor to guide or monitor treatment. Performed at [facility], [address] [place], [place] [address]    WBC 07/26/2020 10.2  4.0 - 10.5 K/uL Final    RBC 07/26/2020 4.91  3.87 - 5.11 MIL/uL Final   Hemoglobin 07/26/2020 14.7  12.0 - 15.0 g/dL Final   HCT 07/26/2020 43.2  36.0 - 46.0 % Final   MCV 07/26/2020 88.0  80.0 - 100.0 fL Final   MCH 07/26/2020 29.9  26.0 - 34.0 pg Final   MCHC 07/26/2020 34.0  30.0 - 36.0 g/dL Final   RDW 07/26/2020 12.7  11.5 - 15.5 % Final   Platelets 07/26/2020 288  150 - 400 K/uL Final   nRBC 07/26/2020 0.0  0.0 - 0.2 % Final   Neutrophils Relative % 07/26/2020 72  % Final   Neutro Abs 07/26/2020 7.3  1.7 - 7.7 K/uL Final   Lymphocytes Relative 07/26/2020 18  % Final   Lymphs Abs 07/26/2020 1.8  0.7 - 4.0 K/uL Final   Monocytes Relative 07/26/2020 5  % Final   Monocytes Absolute 07/26/2020 0.5  0.1 - 1.0 K/uL Final   Eosinophils Relative 07/26/2020 3  % Final   Eosinophils Absolute 07/26/2020 0.3  0.0 - 0.5 K/uL Final   Basophils Relative 07/26/2020 1  % Final   Basophils Absolute 07/26/2020 0.1  0.0 - 0.1 K/uL Final   Immature Granulocytes 07/26/2020 1  % Final   Abs Immature Granulocytes 07/26/2020 0.11 (A) 0.00 - 0.07 K/uL Final   Performed at [facility], [place], [place] [address]   Sodium 07/26/2020 136  135 - 145 mmol/L Final   Potassium 07/26/2020 4.0  3.5 - 5.1 mmol/L Final   Chloride 07/26/2020 101  98 - 111 mmol/L Final   CO2 07/26/2020 26  22 - 32 mmol/L Final   Glucose, Bld 07/26/2020 232 (A) 70 - 99 mg/dL Final   Glucose reference range applies only to samples taken after fasting for at least 8 hours.   BUN 07/26/2020 18  6 - 20 mg/dL Final   Creatinine, Ser 07/26/2020 0.71  0.44 - 1.00 mg/dL Final   Calcium 07/26/2020 9.2  8.9 - 10.3 mg/dL Final   Total Protein 07/26/2020 7.1  6.5 - 8.1 g/dL Final   Albumin 07/26/2020 3.6  3.5 - 5.0 g/dL Final   AST 07/26/2020 20  15 - 41 U/L Final   ALT 07/26/2020 21  0 - 44 U/L Final   Alkaline Phosphatase 07/26/2020 93  38 - 126 U/L Final   Total Bilirubin 07/26/2020 0.7  0.3 - 1.2 mg/dL Final   GFR, Estimated 07/26/2020  >60  >60 mL/min Final   Comment: (NOTE) Calculated using the CKD-EPI Creatinine Equation (2021)    Anion gap 07/26/2020 9  5 - 15 Final   Performed at [facility], [place], [place] [address]   Color, Urine 07/26/2020 YELLOW (A) YELLOW Final  APPearance 07/26/2020 HAZY (A) CLEAR Final   Specific Gravity, Urine 07/26/2020 1.021  1.005 - 1.030 Final   pH 07/26/2020 5.0  5.0 - 8.0 Final   Glucose, UA 07/26/2020 >=500 (A) NEGATIVE mg/dL Final   Hgb urine dipstick 07/26/2020 NEGATIVE  NEGATIVE Final   Bilirubin Urine 07/26/2020 NEGATIVE  NEGATIVE Final   Ketones, ur 07/26/2020 NEGATIVE  NEGATIVE mg/dL Final   Protein, ur 07/26/2020 100 (A) NEGATIVE mg/dL Final   Nitrite 07/26/2020 NEGATIVE  NEGATIVE Final   Leukocytes,Ua 07/26/2020 NEGATIVE  NEGATIVE Final   RBC / HPF 07/26/2020 0-5  0 - 5 RBC/hpf Final   WBC, UA 07/26/2020 0-5  0 - 5 WBC/hpf Final   Bacteria, UA 07/26/2020 RARE (A) NONE SEEN Final   Squamous Epithelial / LPF 07/26/2020 0-5  0 - 5 Final   Mucus 07/26/2020 PRESENT   Final   Performed at [facility], [address] [place], [place] [address]   ABO/RH(D) 07/26/2020 O POS   Final   Antibody Screen 07/26/2020 NEG   Final   Sample Expiration 07/26/2020 08/09/2020,2359   Final   Extend sample reason 07/26/2020    Final                   Value:NO TRANSFUSIONS OR PREGNANCY IN THE PAST 3 MONTHS Performed at [facility], [address] [place], [place] [address]     ECG: Date: 07/26/2020 Time ECG obtained: 0930 AM Rate: 62 bpm Rhythm: normal sinus; IRBBB Axis (leads I and aVF): Right axis deviation Intervals: PR 206 ms. QRS 100 ms. QTc 477 ms. ST segment and T wave changes: No evidence of acute ST segment elevation or depression Comparison: Similar to previous tracing obtained on 11/12/2019   IMAGING / PROCEDURES: ECHOCARDIOGRAM done on 05/04/2019 LVEF >93% Normal LV systolic function Normal RV systolic  function Trivial MR, TR, PR; no AR No valvular stenosis Mild LVH   LEXISCAN done on 05/01/2019 LVEF 55% Regional wall motion reveals normal myocardial thickening and wall motion No artifacts noted Normal LV cavity Moderate anterior apical scar without significant ischemia Overall quality of study is good  Impression and Plan:  Nancy Combs has been referred for pre-anesthesia review and clearance prior to her undergoing the planned anesthetic and procedural courses. Available labs, pertinent testing, and imaging results were personally reviewed by me. This patient has been appropriately cleared by cardiology with an overall LOW risk of significant perioperative cardiovascular complications.  Based on clinical review performed today (08/01/20), barring any significant acute changes in the patient's overall condition, it is anticipated that she will be able to proceed with the planned surgical intervention. Any acute changes in clinical condition may necessitate her procedure being postponed and/or cancelled. Patient will meet with anesthesia team (MD and/or CRNA) on the day of her procedure for preoperative evaluation/assessment. Questions regarding anesthetic course will be fielded at that time.   Pre-surgical instructions were reviewed with the patient during her PAT appointment and questions were fielded by PAT clinical staff. Patient was advised that if any questions or concerns arise prior to her procedure then she should return a call to PAT and/or her surgeon's office to discuss.  Honor Loh, MSN, APRN, FNP-C, CEN [facility]  Peri-operative Services Nurse Practitioner Phone: [phone] Fax: [phone] 08/01/20 2:46 PM  NOTE: This note has been prepared using Dragon dictation software. Despite my best ability to proofread, there is always the potential that unintentional transcriptional errors may still occur  from this process.

## 2020-08-02 ENCOUNTER — Other Ambulatory Visit
Admission: RE | Admit: 2020-08-02 | Discharge: 2020-08-02 | Disposition: A | Discharge status: Home / Self Care | Payer: BC Managed Care – PPO | Source: Ambulatory Visit | Attending: Surgery | Admitting: Surgery

## 2020-08-02 ENCOUNTER — Other Ambulatory Visit: Payer: Self-pay

## 2020-08-02 DIAGNOSIS — Z20822 Contact with and (suspected) exposure to covid-19: Secondary | ICD-10-CM | POA: Insufficient documentation

## 2020-08-02 DIAGNOSIS — Z87891 Personal history of nicotine dependence: Secondary | ICD-10-CM | POA: Diagnosis not present

## 2020-08-02 DIAGNOSIS — I10 Essential (primary) hypertension: Secondary | ICD-10-CM | POA: Diagnosis not present

## 2020-08-02 DIAGNOSIS — Z955 Presence of coronary angioplasty implant and graft: Secondary | ICD-10-CM | POA: Diagnosis not present

## 2020-08-02 DIAGNOSIS — M238X2 Other internal derangements of left knee: Secondary | ICD-10-CM | POA: Diagnosis present

## 2020-08-02 DIAGNOSIS — I2510 Atherosclerotic heart disease of native coronary artery without angina pectoris: Secondary | ICD-10-CM | POA: Diagnosis not present

## 2020-08-02 DIAGNOSIS — E119 Type 2 diabetes mellitus without complications: Secondary | ICD-10-CM | POA: Diagnosis not present

## 2020-08-02 DIAGNOSIS — I129 Hypertensive chronic kidney disease with stage 1 through stage 4 chronic kidney disease, or unspecified chronic kidney disease: Secondary | ICD-10-CM | POA: Diagnosis not present

## 2020-08-02 DIAGNOSIS — Z7982 Long term (current) use of aspirin: Secondary | ICD-10-CM | POA: Diagnosis not present

## 2020-08-02 DIAGNOSIS — Z85118 Personal history of other malignant neoplasm of bronchus and lung: Secondary | ICD-10-CM | POA: Diagnosis not present

## 2020-08-02 DIAGNOSIS — N189 Chronic kidney disease, unspecified: Secondary | ICD-10-CM | POA: Diagnosis not present

## 2020-08-02 DIAGNOSIS — E785 Hyperlipidemia, unspecified: Secondary | ICD-10-CM | POA: Diagnosis not present

## 2020-08-02 DIAGNOSIS — Z79899 Other long term (current) drug therapy: Secondary | ICD-10-CM | POA: Diagnosis not present

## 2020-08-02 DIAGNOSIS — E1122 Type 2 diabetes mellitus with diabetic chronic kidney disease: Secondary | ICD-10-CM | POA: Diagnosis not present

## 2020-08-02 DIAGNOSIS — Z7901 Long term (current) use of anticoagulants: Secondary | ICD-10-CM | POA: Diagnosis not present

## 2020-08-02 DIAGNOSIS — I252 Old myocardial infarction: Secondary | ICD-10-CM | POA: Diagnosis not present

## 2020-08-02 DIAGNOSIS — J449 Chronic obstructive pulmonary disease, unspecified: Secondary | ICD-10-CM | POA: Diagnosis not present

## 2020-08-02 DIAGNOSIS — Z01812 Encounter for preprocedural laboratory examination: Secondary | ICD-10-CM | POA: Insufficient documentation

## 2020-08-02 DIAGNOSIS — Z7902 Long term (current) use of antithrombotics/antiplatelets: Secondary | ICD-10-CM | POA: Diagnosis not present

## 2020-08-02 DIAGNOSIS — Z7984 Long term (current) use of oral hypoglycemic drugs: Secondary | ICD-10-CM | POA: Diagnosis not present

## 2020-08-02 DIAGNOSIS — Z96652 Presence of left artificial knee joint: Secondary | ICD-10-CM | POA: Diagnosis not present

## 2020-08-02 DIAGNOSIS — M2242 Chondromalacia patellae, left knee: Secondary | ICD-10-CM | POA: Diagnosis not present

## 2020-08-02 LAB — SARS CORONAVIRUS 2 (TAT 6-24 HRS): SARS Coronavirus 2: NEGATIVE

## 2020-08-03 MED ORDER — FAMOTIDINE 20 MG PO TABS: 20.0000 mg | ORAL_TABLET | Freq: Once | ORAL | Status: AC

## 2020-08-03 MED ORDER — SODIUM CHLORIDE 0.9 % IV SOLN: INTRAVENOUS | Status: DC

## 2020-08-03 MED ORDER — CEFAZOLIN SODIUM-DEXTROSE 2-4 GM/100ML-% IV SOLN
2.0000 g | INTRAVENOUS | Status: AC
  Administered 2020-08-04: 2 g via INTRAVENOUS

## 2020-08-04 ENCOUNTER — Inpatient Hospital Stay: Payer: BC Managed Care – PPO | Admitting: Urgent Care

## 2020-08-04 ENCOUNTER — Ambulatory Visit
Admission: RE | Admit: 2020-08-04 | Discharge: 2020-08-04 | Disposition: A | Discharge status: Home / Self Care | Payer: BC Managed Care – PPO | Attending: Surgery | Admitting: Surgery

## 2020-08-04 ENCOUNTER — Other Ambulatory Visit: Payer: Self-pay

## 2020-08-04 ENCOUNTER — Encounter: Payer: Self-pay | Admitting: Surgery

## 2020-08-04 ENCOUNTER — Inpatient Hospital Stay: Payer: BC Managed Care – PPO

## 2020-08-04 ENCOUNTER — Encounter
Admission: RE | Disposition: A | Discharge status: Home / Self Care | Payer: Self-pay | Source: Home / Self Care | Attending: Surgery

## 2020-08-04 DIAGNOSIS — Z96652 Presence of left artificial knee joint: Secondary | ICD-10-CM

## 2020-08-04 DIAGNOSIS — M238X2 Other internal derangements of left knee: Secondary | ICD-10-CM | POA: Diagnosis not present

## 2020-08-04 DIAGNOSIS — Z7984 Long term (current) use of oral hypoglycemic drugs: Secondary | ICD-10-CM | POA: Insufficient documentation

## 2020-08-04 DIAGNOSIS — Z7982 Long term (current) use of aspirin: Secondary | ICD-10-CM | POA: Insufficient documentation

## 2020-08-04 DIAGNOSIS — Z7902 Long term (current) use of antithrombotics/antiplatelets: Secondary | ICD-10-CM | POA: Insufficient documentation

## 2020-08-04 DIAGNOSIS — Z955 Presence of coronary angioplasty implant and graft: Secondary | ICD-10-CM | POA: Insufficient documentation

## 2020-08-04 DIAGNOSIS — E1122 Type 2 diabetes mellitus with diabetic chronic kidney disease: Secondary | ICD-10-CM | POA: Insufficient documentation

## 2020-08-04 DIAGNOSIS — N189 Chronic kidney disease, unspecified: Secondary | ICD-10-CM | POA: Insufficient documentation

## 2020-08-04 DIAGNOSIS — J449 Chronic obstructive pulmonary disease, unspecified: Secondary | ICD-10-CM | POA: Insufficient documentation

## 2020-08-04 DIAGNOSIS — M2242 Chondromalacia patellae, left knee: Secondary | ICD-10-CM | POA: Insufficient documentation

## 2020-08-04 DIAGNOSIS — Z79899 Other long term (current) drug therapy: Secondary | ICD-10-CM | POA: Insufficient documentation

## 2020-08-04 DIAGNOSIS — E785 Hyperlipidemia, unspecified: Secondary | ICD-10-CM | POA: Insufficient documentation

## 2020-08-04 DIAGNOSIS — I129 Hypertensive chronic kidney disease with stage 1 through stage 4 chronic kidney disease, or unspecified chronic kidney disease: Secondary | ICD-10-CM | POA: Insufficient documentation

## 2020-08-04 DIAGNOSIS — Z87891 Personal history of nicotine dependence: Secondary | ICD-10-CM | POA: Insufficient documentation

## 2020-08-04 DIAGNOSIS — Z20822 Contact with and (suspected) exposure to covid-19: Secondary | ICD-10-CM | POA: Insufficient documentation

## 2020-08-04 DIAGNOSIS — Z85118 Personal history of other malignant neoplasm of bronchus and lung: Secondary | ICD-10-CM | POA: Insufficient documentation

## 2020-08-04 DIAGNOSIS — I2510 Atherosclerotic heart disease of native coronary artery without angina pectoris: Secondary | ICD-10-CM | POA: Insufficient documentation

## 2020-08-04 DIAGNOSIS — E119 Type 2 diabetes mellitus without complications: Secondary | ICD-10-CM | POA: Insufficient documentation

## 2020-08-04 DIAGNOSIS — I252 Old myocardial infarction: Secondary | ICD-10-CM | POA: Insufficient documentation

## 2020-08-04 DIAGNOSIS — Z7901 Long term (current) use of anticoagulants: Secondary | ICD-10-CM | POA: Insufficient documentation

## 2020-08-04 DIAGNOSIS — I10 Essential (primary) hypertension: Secondary | ICD-10-CM | POA: Insufficient documentation

## 2020-08-04 HISTORY — DX: Long term (current) use of anticoagulants: Z79.01

## 2020-08-04 HISTORY — DX: Hyperlipidemia, unspecified: E78.5

## 2020-08-04 HISTORY — PX: TOTAL KNEE REVISION: SHX996

## 2020-08-04 LAB — GLUCOSE, CAPILLARY
Glucose-Capillary: 198 mg/dL — ABNORMAL HIGH (ref 70–99)
Glucose-Capillary: 156 mg/dL — ABNORMAL HIGH (ref 70–99)

## 2020-08-04 SURGERY — TOTAL KNEE REVISION
Anesthesia: Spinal | Site: Knee | Laterality: Left

## 2020-08-04 MED ORDER — PROPOFOL 500 MG/50ML IV EMUL
INTRAVENOUS | Status: DC | PRN
  Administered 2020-08-04: 75 ug/kg/min via INTRAVENOUS

## 2020-08-04 MED ORDER — ACETAMINOPHEN 10 MG/ML IV SOLN
INTRAVENOUS | Status: AC
  Filled 2020-08-04: qty 100

## 2020-08-04 MED ORDER — FENTANYL CITRATE (PF) 100 MCG/2ML IJ SOLN
INTRAMUSCULAR | Status: AC
  Filled 2020-08-04: qty 2

## 2020-08-04 MED ORDER — OXYCODONE HCL 5 MG PO TABS: 5.0000 mg | ORAL_TABLET | ORAL | 0 refills | Status: DC | PRN

## 2020-08-04 MED ORDER — PROMETHAZINE HCL 25 MG/ML IJ SOLN: 6.2500 mg | INTRAMUSCULAR | Status: DC | PRN

## 2020-08-04 MED ORDER — 0.9 % SODIUM CHLORIDE (POUR BTL) OPTIME
TOPICAL | Status: DC | PRN
  Administered 2020-08-04: 1000 mL

## 2020-08-04 MED ORDER — PROPOFOL 10 MG/ML IV BOLUS
INTRAVENOUS | Status: DC | PRN
  Administered 2020-08-04: 40 mg via INTRAVENOUS

## 2020-08-04 MED ORDER — CEFAZOLIN SODIUM-DEXTROSE 2-4 GM/100ML-% IV SOLN
2.0000 g | Freq: Four times a day (QID) | INTRAVENOUS | Status: DC
  Administered 2020-08-04: 2 g via INTRAVENOUS

## 2020-08-04 MED ORDER — FENTANYL CITRATE (PF) 100 MCG/2ML IJ SOLN: 25.0000 ug | INTRAMUSCULAR | Status: DC | PRN

## 2020-08-04 MED ORDER — CEFAZOLIN SODIUM-DEXTROSE 2-4 GM/100ML-% IV SOLN
INTRAVENOUS | Status: AC
  Filled 2020-08-04: qty 100

## 2020-08-04 MED ORDER — OXYCODONE HCL 5 MG PO TABS
5.0000 mg | ORAL_TABLET | ORAL | Status: DC | PRN
  Administered 2020-08-04 (×2): 5 mg via ORAL

## 2020-08-04 MED ORDER — METOCLOPRAMIDE HCL 10 MG PO TABS: 5.0000 mg | ORAL_TABLET | Freq: Three times a day (TID) | ORAL | Status: DC | PRN

## 2020-08-04 MED ORDER — TRANEXAMIC ACID 1000 MG/10ML IV SOLN
INTRAVENOUS | Status: AC
  Filled 2020-08-04: qty 10

## 2020-08-04 MED ORDER — ORAL CARE MOUTH RINSE
15.0000 mL | Freq: Once | OROMUCOSAL | Status: AC
Start: 2020-08-04 — End: 2020-08-04

## 2020-08-04 MED ORDER — METOCLOPRAMIDE HCL 5 MG/ML IJ SOLN: 5.0000 mg | Freq: Three times a day (TID) | INTRAMUSCULAR | Status: DC | PRN

## 2020-08-04 MED ORDER — SODIUM CHLORIDE 0.9 % IV SOLN
INTRAVENOUS | Status: DC | PRN
  Administered 2020-08-04: 60 mL

## 2020-08-04 MED ORDER — PROPOFOL 10 MG/ML IV BOLUS
INTRAVENOUS | Status: AC
  Filled 2020-08-04: qty 20

## 2020-08-04 MED ORDER — ONDANSETRON HCL 4 MG/2ML IJ SOLN: 4.0000 mg | Freq: Four times a day (QID) | INTRAMUSCULAR | Status: DC | PRN

## 2020-08-04 MED ORDER — BUPIVACAINE HCL (PF) 0.5 % IJ SOLN
INTRAMUSCULAR | Status: DC | PRN
  Administered 2020-08-04: 3 mL

## 2020-08-04 MED ORDER — CHLORHEXIDINE GLUCONATE 0.12 % MT SOLN
OROMUCOSAL | Status: AC
  Administered 2020-08-04: 15 mL via OROMUCOSAL
  Filled 2020-08-04: qty 15

## 2020-08-04 MED ORDER — PROPOFOL 1000 MG/100ML IV EMUL
INTRAVENOUS | Status: AC
  Filled 2020-08-04: qty 100

## 2020-08-04 MED ORDER — SODIUM CHLORIDE 0.9 % IR SOLN
Status: DC | PRN
  Administered 2020-08-04: 3000 mL

## 2020-08-04 MED ORDER — ONDANSETRON HCL 4 MG PO TABS: 4.0000 mg | ORAL_TABLET | Freq: Four times a day (QID) | ORAL | Status: DC | PRN

## 2020-08-04 MED ORDER — BUPIVACAINE-EPINEPHRINE (PF) 0.5% -1:200000 IJ SOLN
INTRAMUSCULAR | Status: AC
  Filled 2020-08-04: qty 30

## 2020-08-04 MED ORDER — ACETAMINOPHEN 500 MG PO TABS
1000.0000 mg | ORAL_TABLET | Freq: Four times a day (QID) | ORAL | Status: DC
  Administered 2020-08-04: 1000 mg via ORAL

## 2020-08-04 MED ORDER — IPRATROPIUM-ALBUTEROL 0.5-2.5 (3) MG/3ML IN SOLN
3.0000 mL | RESPIRATORY_TRACT | Status: AC
  Administered 2020-08-04: 3 mL via RESPIRATORY_TRACT

## 2020-08-04 MED ORDER — SODIUM CHLORIDE 0.9 % IV BOLUS
250.0000 mL | Freq: Once | INTRAVENOUS | Status: AC
  Administered 2020-08-04: 250 mL via INTRAVENOUS

## 2020-08-04 MED ORDER — BUPIVACAINE HCL (PF) 0.5 % IJ SOLN
INTRAMUSCULAR | Status: AC
  Filled 2020-08-04: qty 10

## 2020-08-04 MED ORDER — ONDANSETRON HCL 4 MG/2ML IJ SOLN
INTRAMUSCULAR | Status: DC | PRN
  Administered 2020-08-04: 4 mg via INTRAVENOUS

## 2020-08-04 MED ORDER — KETOROLAC TROMETHAMINE 15 MG/ML IJ SOLN: 15.0000 mg | Freq: Once | INTRAMUSCULAR | Status: AC

## 2020-08-04 MED ORDER — SODIUM CHLORIDE 0.9 % IV SOLN
INTRAVENOUS | Status: DC | PRN
  Administered 2020-08-04: 15 ug/min via INTRAVENOUS

## 2020-08-04 MED ORDER — KETOROLAC TROMETHAMINE 30 MG/ML IJ SOLN
INTRAMUSCULAR | Status: AC
  Filled 2020-08-04: qty 1

## 2020-08-04 MED ORDER — KETOROLAC TROMETHAMINE 15 MG/ML IJ SOLN
INTRAMUSCULAR | Status: AC
  Administered 2020-08-04: 15 mg via INTRAVENOUS
  Filled 2020-08-04: qty 1

## 2020-08-04 MED ORDER — KETOROLAC TROMETHAMINE 15 MG/ML IJ SOLN
7.5000 mg | Freq: Four times a day (QID) | INTRAMUSCULAR | Status: DC
  Administered 2020-08-04: 7.5 mg via INTRAVENOUS

## 2020-08-04 MED ORDER — CHLORHEXIDINE GLUCONATE 0.12 % MT SOLN: 15.0000 mL | Freq: Once | OROMUCOSAL | Status: AC

## 2020-08-04 MED ORDER — MIDAZOLAM HCL 5 MG/5ML IJ SOLN
INTRAMUSCULAR | Status: DC | PRN
  Administered 2020-08-04: 1 mg via INTRAVENOUS

## 2020-08-04 MED ORDER — ACETAMINOPHEN 500 MG PO TABS
ORAL_TABLET | ORAL | Status: AC
  Filled 2020-08-04: qty 2

## 2020-08-04 MED ORDER — OXYCODONE HCL 5 MG PO TABS
ORAL_TABLET | ORAL | Status: AC
  Filled 2020-08-04: qty 1

## 2020-08-04 MED ORDER — ACETAMINOPHEN 10 MG/ML IV SOLN
INTRAVENOUS | Status: DC | PRN
  Administered 2020-08-04: 1000 mg via INTRAVENOUS

## 2020-08-04 MED ORDER — APIXABAN 2.5 MG PO TABS: 2.5000 mg | ORAL_TABLET | Freq: Two times a day (BID) | ORAL | 0 refills | Status: AC

## 2020-08-04 MED ORDER — MIDAZOLAM HCL 2 MG/2ML IJ SOLN
INTRAMUSCULAR | Status: AC
  Filled 2020-08-04: qty 2

## 2020-08-04 MED ORDER — ONDANSETRON HCL 4 MG/2ML IJ SOLN
INTRAMUSCULAR | Status: AC
  Filled 2020-08-04: qty 2

## 2020-08-04 MED ORDER — EPHEDRINE SULFATE 50 MG/ML IJ SOLN
INTRAMUSCULAR | Status: DC | PRN
  Administered 2020-08-04: 5 mg via INTRAVENOUS

## 2020-08-04 MED ORDER — BUPIVACAINE-EPINEPHRINE (PF) 0.5% -1:200000 IJ SOLN
INTRAMUSCULAR | Status: DC | PRN
  Administered 2020-08-04: 30 mL

## 2020-08-04 MED ORDER — SODIUM CHLORIDE 0.9 % IV SOLN: INTRAVENOUS | Status: DC

## 2020-08-04 MED ORDER — SODIUM CHLORIDE FLUSH 0.9 % IV SOLN
INTRAVENOUS | Status: AC
  Filled 2020-08-04: qty 40

## 2020-08-04 MED ORDER — FENTANYL CITRATE (PF) 100 MCG/2ML IJ SOLN
INTRAMUSCULAR | Status: DC | PRN
  Administered 2020-08-04: 12.5 ug via INTRAVENOUS
  Administered 2020-08-04: 25 ug via INTRAVENOUS
  Administered 2020-08-04: 12.5 ug via INTRAVENOUS
  Administered 2020-08-04 (×2): 25 ug via INTRAVENOUS

## 2020-08-04 MED ORDER — LIDOCAINE HCL (PF) 2 % IJ SOLN
INTRAMUSCULAR | Status: AC
  Filled 2020-08-04: qty 5

## 2020-08-04 MED ORDER — IPRATROPIUM-ALBUTEROL 0.5-2.5 (3) MG/3ML IN SOLN
RESPIRATORY_TRACT | Status: AC
  Filled 2020-08-04: qty 3

## 2020-08-04 MED ORDER — FAMOTIDINE 20 MG PO TABS
ORAL_TABLET | ORAL | Status: AC
  Administered 2020-08-04: 20 mg via ORAL
  Filled 2020-08-04: qty 1

## 2020-08-04 MED ORDER — BUPIVACAINE LIPOSOME 1.3 % IJ SUSP
INTRAMUSCULAR | Status: AC
  Filled 2020-08-04: qty 20

## 2020-08-04 SURGICAL SUPPLY — 62 items
APL PRP STRL LF DISP 70% ISPRP (MISCELLANEOUS) ×1
BAR LOCKING TIBIAL (Orthopedic Implant) IMPLANT
BAR ORTH LCK STRL TIB TI VNGRD (Orthopedic Implant) ×1 IMPLANT
BLADE SAW SAG 25X90X1.19 (BLADE) ×2 IMPLANT
BNDG COHESIVE 6X5 TAN ST LF (GAUZE/BANDAGES/DRESSINGS) ×2 IMPLANT
BNDG ELASTIC 6X5.8 VLCR STR LF (GAUZE/BANDAGES/DRESSINGS) ×2 IMPLANT
BRNG TIB 67X14 ANT STAB MDLR (Insert) ×1 IMPLANT
CEMENT BONE R 1X40 (Cement) ×3 IMPLANT
CEMENT VACUUM MIXING SYSTEM (MISCELLANEOUS) ×2 IMPLANT
CHLORAPREP W/TINT 26 (MISCELLANEOUS) ×2 IMPLANT
COOLER POLAR GLACIER W/PUMP (MISCELLANEOUS) ×2 IMPLANT
CUFF TOURN SGL QUICK 24 (TOURNIQUET CUFF)
CUFF TOURN SGL QUICK 34 (TOURNIQUET CUFF) ×2
CUFF TRNQT CYL 24X4X16.5-23 (TOURNIQUET CUFF) IMPLANT
CUFF TRNQT CYL 34X4.125X (TOURNIQUET CUFF) IMPLANT
DRAPE IMP U-DRAPE 54X76 (DRAPES) ×2 IMPLANT
DRAPE SURG 17X11 SM STRL (DRAPES) ×4 IMPLANT
DRSG MEPILEX SACRM 8.7X9.8 (GAUZE/BANDAGES/DRESSINGS) ×2 IMPLANT
DRSG OPSITE POSTOP 4X14 (GAUZE/BANDAGES/DRESSINGS) ×1 IMPLANT
DRSG OPSITE POSTOP 4X8 (GAUZE/BANDAGES/DRESSINGS) ×1 IMPLANT
ELECT CAUTERY BLADE 6.4 (BLADE) IMPLANT
ELECT REM PT RETURN 9FT ADLT (ELECTROSURGICAL) ×2
ELECTRODE REM PT RTRN 9FT ADLT (ELECTROSURGICAL) ×1 IMPLANT
GAUZE 4X4 16PLY ~~LOC~~+RFID DBL (SPONGE) ×2 IMPLANT
GLOVE SRG 8 PF TXTR STRL LF DI (GLOVE) ×1 IMPLANT
GLOVE SURG ENC MOIS LTX SZ7.5 (GLOVE) ×8 IMPLANT
GLOVE SURG ENC MOIS LTX SZ8 (GLOVE) ×8 IMPLANT
GLOVE SURG UNDER LTX SZ8 (GLOVE) ×2 IMPLANT
GLOVE SURG UNDER POLY LF SZ8 (GLOVE) ×2
GOWN STRL REUS W/ TWL LRG LVL3 (GOWN DISPOSABLE) ×1 IMPLANT
GOWN STRL REUS W/ TWL XL LVL3 (GOWN DISPOSABLE) ×1 IMPLANT
GOWN STRL REUS W/TWL LRG LVL3 (GOWN DISPOSABLE) ×2
GOWN STRL REUS W/TWL XL LVL3 (GOWN DISPOSABLE) ×2
HOOD PEEL AWAY FLYTE STAYCOOL (MISCELLANEOUS) ×7 IMPLANT
IMMBOLIZER KNEE 19 BLUE UNIV (SOFTGOODS) ×1 IMPLANT
INSERT TIB BEARING 67X14 (Insert) ×1 IMPLANT
IV NS IRRIG 3000ML ARTHROMATIC (IV SOLUTION) ×2 IMPLANT
KIT TURNOVER KIT A (KITS) ×2 IMPLANT
MANIFOLD NEPTUNE II (INSTRUMENTS) ×2 IMPLANT
NDL SPNL 20GX3.5 QUINCKE YW (NEEDLE) ×1 IMPLANT
NEEDLE SPNL 20GX3.5 QUINCKE YW (NEEDLE) ×2 IMPLANT
NS IRRIG 500ML POUR BTL (IV SOLUTION) ×2 IMPLANT
PACK TOTAL KNEE (MISCELLANEOUS) ×2 IMPLANT
PAD WRAPON POLAR KNEE (MISCELLANEOUS) ×1 IMPLANT
PATELLA SERIES A (Orthopedic Implant) ×1 IMPLANT
PENCIL SMOKE EVACUATOR (MISCELLANEOUS) ×2 IMPLANT
PULSAVAC PLUS IRRIG FAN TIP (DISPOSABLE) ×2
SPONGE T-LAP 18X18 ~~LOC~~+RFID (SPONGE) ×5 IMPLANT
STAPLER SKIN PROX 35W (STAPLE) ×2 IMPLANT
SUCTION FRAZIER HANDLE 10FR (MISCELLANEOUS) ×1
SUCTION TUBE FRAZIER 10FR DISP (MISCELLANEOUS) ×1 IMPLANT
SUT VIC AB 0 CT1 36 (SUTURE) ×8 IMPLANT
SUT VIC AB 2-0 CT1 (SUTURE) ×1 IMPLANT
SUT VIC AB 2-0 CT1 27 (SUTURE) ×8
SUT VIC AB 2-0 CT1 TAPERPNT 27 (SUTURE) ×4 IMPLANT
SYR 10ML LL (SYRINGE) ×2 IMPLANT
SYR 20ML LL LF (SYRINGE) ×2 IMPLANT
SYR 30ML LL (SYRINGE) ×4 IMPLANT
TIBIAL LOCKING BAR (Orthopedic Implant) ×2 IMPLANT
TIP FAN IRRIG PULSAVAC PLUS (DISPOSABLE) ×1 IMPLANT
TRAY FOLEY SLVR 16FR LF STAT (SET/KITS/TRAYS/PACK) ×1 IMPLANT
WRAPON POLAR PAD KNEE (MISCELLANEOUS) ×2

## 2020-08-04 NOTE — H&P (Addendum)
History of Present Illness:  Nancy Combs is a 60 y.o. female who presents for follow-up now 9 months status post a left total knee arthroplasty. Her postoperative course was complicated by an apparent twisting injury 5 months ago, resulting in some postoperative ligamentous laxity which initially was managed with physical therapy and bracing. Since her last visit 2 months ago, the patient feels that her symptoms have worsened slightly. She continues to complain of moderate pain in the lateral aspect of her knee which she rates at 5/10, and for which she has been taking only over-the-counter medications as necessary with limited relief. She notes that the knee continues to buckle, especially when descending stairs and with certain twisting motions. She does not find the knee brace to be of benefit. She denies any reinjury to the knee, and denies any fevers or chills. She admits that she has not been doing her exercises as regularly as she should, and admits that she has been putting on weight. She continues to ambulate with a cane for balance and support. She has been working from home. Since her last visit, she has undergone an EMG of both lower extremities and presents today to review these results.  Current Outpatient Medications:  amLODIPine (NORVASC) 10 MG tablet Take 1 tablet (10 mg total) by mouth once daily 30 tablet 9   aspirin 81 MG EC tablet Take 81 mg by mouth once daily   clopidogreL (PLAVIX) 75 mg tablet Take 1 tablet (75 mg total) by mouth once daily 30 tablet 7   folic acid (FOLVITE) 1 MG tablet TAKE 1 TABLET(1 MG) BY MOUTH EVERY DAY 30 tablet 1   glipiZIDE (GLUCOTROL) 5 MG tablet TAKE 1 TABLET(5 MG) BY MOUTH TWICE DAILY BEFORE MEALS 60 tablet 4   hydroCHLOROthiazide (HYDRODIURIL) 25 MG tablet Take 1 tablet (25 mg total) by mouth once daily 30 tablet 9   loratadine (CLARITIN) 10 mg tablet Take 10 mg by mouth once daily   losartan (COZAAR) 100 MG tablet TAKE 1 TABLET(100 MG) BY MOUTH EVERY  DAY 30 tablet 11   metFORMIN (GLUCOPHAGE-XR) 500 MG XR tablet 1 po in am and 2 po in pm( patient states she takes a total 1500 mg daily) 270 tablet 4   metoprolol succinate (TOPROL-XL) 100 MG XL tablet Take 1 tablet (100 mg total) by mouth once daily 30 tablet 9   NARCAN 4 mg/actuation nasal spray   simvastatin (ZOCOR) 20 MG tablet Take 1 tablet (20 mg total) by mouth nightly 30 tablet 7   tiotropium-olodateroL (STIOLTO RESPIMAT) 2.5-2.5 mcg/actuation inhaler Inhale 2 inhalations into the lungs once daily 4 g 2   varenicline (CHANTIX) 1 mg tablet Take one tablet each morning and evening with food 60 tablet 2   varicella virus vaccine, recombinant, (SHINGRIX) IM injection Administer as directed. 1 each 1   lancets Use 1 each 3 (three) times daily Use as instructed. 100 each 12   Allergies:   Nicotine patch Hives and Rash   Adhesive Hives   Past Medical History:   COPD (chronic obstructive pulmonary disease) (CMS-HCC)   Diabetes mellitus without complication (CMS-HCC) - prediabetes   History of abnormal cervical Pap smear 2019 (with HPV)   History of myocardial infarction   Hyperlipidemia   Hypertension   PV (polycythemia vera) (CMS-HCC)   Thyroid disease (Left nodule removed at age 21)   Past Surgical History:   cardiac stent   CESAREAN SECTION   CREATION PERICARDIAL WINDOW   Left TKA using all-cemented  [employer] system with a 65 mm PCR femur and a 67 mm tibial tray with a 10 mm anterior stabilized E-poly insert. Left 11/19/2019 (Dr. Roland Rack)   THORACOTOMY W/BIOPSY LUNG   THYROIDECTOMY TOTAL Left   Family History:   Lung cancer Maternal Aunt   Aneurysm Maternal Aunt   Lung cancer Maternal Grandmother   High blood pressure (Hypertension) Mother   Diabetes type II Mother   Social History:   Socioeconomic History:   Marital status: Divorced   Number of children: 3  Occupational History   Occupation: employed at an accounts payable office  Tobacco Use   Smoking status:  Former Smoker  Packs/day: 1.00  Years: 45.00  Pack years: 45.00  Types: Cigarettes  Quit date: 08/18/2019  Years since quitting: 0.8   Smokeless tobacco: Never Used  Vaping Use   Vaping Use: Never used  Substance and Sexual Activity   Alcohol use: Not Currently   Drug use: Not Currently  Comment: no drugs since she was a teenager   Review of Systems:  A comprehensive 14 point ROS was performed, reviewed, and the pertinent orthopaedic findings are documented in the HPI.  Physical Exam: Vitals:  07/08/20 1440  BP: 136/80  Weight: (!) 106.6 kg (235 lb)  Height: 160 cm (5\' 3" )  PainSc: 5  PainLoc: Knee   General/Constitutional: Pleasant overweight middle-aged female in no acute distress. Neuro/Psych: Normal mood and affect, oriented to person, place and time.  Eyes: Non-icteric. Pupils are equal, round, and reactive to light, and exhibit synchronous movement. ENT: Unremarkable. Lymphatic: No palpable adenopathy. Respiratory: Lungs clear to auscultation, Normal chest excursion, No wheezes and Non-labored breathing Cardiovascular: Regular rate and rhythm. No murmurs. and No edema, swelling or tenderness, except as noted in detailed exam. Integumentary: No impressive skin lesions present, except as noted in detailed exam. Musculoskeletal: Unremarkable, except as noted in detailed exam.  Left knee exam: The patient demonstrate a mild limp, favoring her left leg, and uses a cane for balance and support.  On examination, her surgical incision is well-healed and without evidence for infection.  There is mild swelling around the knee, but no erythema, ecchymosis, abrasions, or other skin abnormalities are identified.  She exhibits a trace effusion.  Actively, she is able to extend her knee to near 0 degrees and can flex the knee to 95 degrees.  Passively, the knee can be flexed to 105 degrees. She experiences mild-moderate pain with maximal flexion.  Her patella tracks well and is without  crepitance. There is mild-moderate laxity with varus and valgus stressing, and she has a 1-2+ Lachman maneuver.  She remains neurovascularly intact to the left lower extremity and foot.  EMG results:  A recent EMG of both lower extremities is available for review. 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 This report was reviewed by myself and discussed with the patient.  Assessment: 1. Primary osteoarthritis of left knee.  2. Status post total knee replacement using cement, left.  3. Ligamentous laxity of left knee.   Plan: The treatment options were discussed with the patient. In addition, patient educational materials were provided regarding the diagnosis and treatment options. The patient is quite frustrated by her persistent symptoms and functional limitations, and is ready to consider more aggressive treatment options. Given the lack of a focal peripheral neuropathy accounting for her left lower extremity  weakness, I feel that the majority of her symptoms are due to laxity around her left knee replacement. Therefore, I have recommended a surgical procedure, specifically the insertion of a thicker piece of polyethylene to tighten her knee ligaments. The procedure was discussed with the patient, as were the potential risks (including bleeding, infection, nerve and/or blood vessel injury, persistent or recurrent pain, loosening and/or failure of the components, dislocation, need for further surgery, blood clots, strokes, heart attacks and/or arhythmias, pneumonia, etc.) and benefits. The patient states her understanding and wishes to proceed. All of the patient's questions and concerns were answered. She can call any time with further concerns. She will follow up post-surgery, routine.   H&P reviewed and patient re-examined. No  changes.

## 2020-08-04 NOTE — Op Note (Signed)
08/04/2020  12:38 PM  Patient:   Nancy Combs  Pre-Op Diagnosis:   Progressive laxity status post left total knee arthroplasty.  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  Procedure:   Revision left TKA with a polyethylene exchange to a 14 mm anterior stabilized E-poly insert, and a 31 x 8 mm all-poly 3-pegged domed patella.  Surgeon:   Pascal Lux, MD  Assistant:   Cameron Proud, PA-C; Ardelle Park, PA-S  Anesthesia:   Spinal  Findings:   As above  Complications:   None  EBL:   5 cc  Fluids:   800 cc crystalloid  UOP:   None  TT:   75 minutes at 300 mmHg  Drains:   None  Closure:   Staples  Implants:   As above  Brief Clinical Note:   The patient is a 60 year old female who is now 9 months status post a left total knee arthroplasty. About 4 months postoperatively, the patient apparently tripped and fell, aggravating her left knee. Since then, she has noted an increased of her left knee giving way associated with increasing left knee pain. The patient's symptoms have progressed despite medications, activity modification, injections, etc. The patient's history and examination were consistent with instability status post a left total knee arthroplasty and presents at this time for a revision total knee arthroplasty with polyethylene exchange to a thicker component.  Procedure:   The patient was brought into the operating room. After adequate spinal anesthesia was obtained, the patient was lain in the supine position before the left lower extremity was prepped with ChloraPrep solution and draped sterilely. Preoperative antibiotics were administered. After verifying the proper laterality with a surgical timeout, the limb was exsanguinated with an Esmarch and the tourniquet inflated to 300 mmHg.   Utilizing the previous incision, a standard anterior approach to the knee was made through an approximately 7 inch incision. The incision was  carried down through the subcutaneous tissues to expose superficial retinaculum. This was split the length of the incision and the medial flap elevated sufficiently to expose the medial retinaculum. The medial retinaculum was incised, leaving a 3-4 mm cuff of tissue on the patella. This was extended distally along the medial border of the patellar tendon and proximally through the medial third of the quadriceps tendon. The soft tissues were elevated off the anteromedial and anterolateral aspects of the proximal tibia to the level of the collateral ligaments. A tonsil was used to unclip the key locking mechanism before the polyethylene was removed.  Its size was verified at 10x67 mm. Trial reductions using the 12 mm and 14 mm thick polyethylene trials were performed. The 14 x 67 mm component demonstrated excellent stability to varus valgus stressing both in flexion and extension, yet permitted full extension.  The patella has not been resurfaced at the initial procedure due to the patient's age. However, there was is moderate chondromalacia noted involving the mid and inferior portions of the patellar surface. Therefore, it was elected to resurface the patella at this time. The patella thickness was measured and found to be 20 mm. Therefore, the appropriate cut was made. The patellar surface was measured and found to be optimally replicated by the 31 mm component. The three peg holes were drilled in place before the trial button was inserted. Patella tracking was assessed and found to be excellent, passing the "no thumb test".   The permanent 14 mm anterior stabilized E-polyethylene insert was positioned  and secured using the appropriate key locking mechanism. The bony surface of the patella was prepared for cementing by irrigating it thoroughly with sterile saline solution via the jet lavage system before drying it with a dry lap sponge. Meanwhile, the cement was being mixed on the back table. When it was ready,  the patella was cemented into place and secured using the patellar clamp. The excess cement was removed using [employer] fast streamer. Once the cement had hardened, the knee was placed through a range of motion with the findings as described above.Again the knee was placed through a range of motion with the findings as described above.  The wound was copiously irrigated with sterile saline solution using the jet lavage system before the quadriceps tendon and retinacular layer were reapproximated using #0 Vicryl interrupted sutures. The superficial retinacular layer also was closed using a running #0 Vicryl suture. The subcutaneous tissues were closed in several layers using 2-0 Vicryl interrupted sutures. The skin was closed using staples. A sterile honeycomb dressing was applied to the skin before the leg was wrapped with an Ace wrap to accommodate the Polar Care device. The patient was then awakened and returned to the recovery room in satisfactory condition after tolerating the procedure well.

## 2020-08-04 NOTE — Anesthesia Procedure Notes (Signed)
Spinal  Patient location during procedure: OR Start time: 08/04/2020 10:37 AM End time: 08/04/2020 10:41 AM Reason for block: surgical anesthesia Staffing Anesthesiologist: Martha Clan, MD Resident/CRNA: Daryel Gerald, RN Preanesthetic Checklist Completed: patient identified, IV checked, site marked, risks and benefits discussed, surgical consent, monitors and equipment checked, pre-op evaluation and timeout performed Spinal Block Patient position: sitting Prep: DuraPrep Patient monitoring: heart rate, cardiac monitor, continuous pulse ox and blood pressure Approach: midline Location: L3-4 Injection technique: single-shot Needle Needle type: Sprotte  Needle gauge: 24 G Needle length: 9 cm Assessment Sensory level: T4 Events: CSF return

## 2020-08-04 NOTE — Discharge Instructions (Addendum)
Orthopedic discharge instructions: May sponge bathe or shower with intact OpSite dressing. Apply ice frequently to knee or use Polar Care. Start Eliquis 2.5 mg twice daily for 2 weeks on 03/08/2020, then take aspirin 325 mg daily for 4 weeks. Take oxycodone as prescribed when needed.  May supplement with ES Tylenol if necessary. May weight-bear as tolerated on left lower extremity - use walker for balance and support. Follow-up in 10-14 days or as scheduled.  AMBULATORY SURGERY  DISCHARGE INSTRUCTIONS   The drugs that you were given will stay in your system until tomorrow so for the next 24 hours you should not:  Drive an automobile Make any legal decisions Drink any alcoholic beverage   You may resume regular meals tomorrow.  Today it is better to start with liquids and gradually work up to solid foods.  You may eat anything you prefer, but it is better to start with liquids, then soup and crackers, and gradually work up to solid foods.   Please notify your doctor immediately if you have any unusual bleeding, trouble breathing, redness and pain at the surgery site, drainage, fever, or pain not relieved by medication.    Additional Instructions:  Keep green arm band on for 4 days   Please contact your physician with any problems or Same Day Surgery at [phone], Monday through Friday 6 am to 4 pm, or [place] at [facility] number at [phone].

## 2020-08-04 NOTE — Transfer of Care (Signed)
Immediate Anesthesia Transfer of Care Note  Patient: Nancy Combs  Procedure(s) Performed: POLYETHYLENE EXCHANGE OF LEFT KNEE (Left: Knee)  Patient Location: PACU  Anesthesia Type:Spinal  Level of Consciousness: awake, drowsy and patient cooperative  Airway & Oxygen Therapy: Patient Spontanous Breathing and Patient connected to face mask oxygen  Post-op Assessment: Report given to RN and Post -op Vital signs reviewed and stable  Post vital signs: Reviewed and stable  Last Vitals:  Vitals Value Taken Time  BP    Temp    Pulse    Resp    SpO2      Last Pain:  Vitals:   08/04/20 0815  TempSrc: Temporal  PainSc: 1          Complications: No notable events documented.

## 2020-08-04 NOTE — Anesthesia Preprocedure Evaluation (Signed)
Anesthesia Evaluation  Patient identified by MRN, date of birth, ID band Patient awake    Reviewed: Allergy & Precautions, H&P , NPO status , reviewed documented beta blocker date and time   History of Anesthesia Complications Negative for: history of anesthetic complications  Airway Mallampati: III  TM Distance: >3 FB Neck ROM: limited    Dental  (+) Edentulous Upper, Partial Lower, Missing, Dental Advidsory Given   Pulmonary shortness of breath, neg sleep apnea, COPD, neg recent URI, former smoker,    Pulmonary exam normal        Cardiovascular hypertension, (-) angina+ CAD and + Past MI  Normal cardiovascular exam(-) dysrhythmias (-) Valvular Problems/Murmurs     Neuro/Psych negative neurological ROS     GI/Hepatic neg GERD  ,  Endo/Other  diabetesMorbid obesity  Renal/GU Renal disease     Musculoskeletal  (+) Arthritis ,   Abdominal   Peds  Hematology   Anesthesia Other Findings Past Medical History: No date: CAD (coronary artery disease) No date: Cancer ([place])     Comment:  lung No date: Chronic kidney disease     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  No date: COPD (chronic obstructive pulmonary disease) (HCC) No date: Diabetes mellitus without complication (HCC) No date: Dyspnea No date: Hypertension No date: MI (myocardial infarction) ([place]) No date: PV (polycythemia vera) ([place])  Past Surgical History: No date: CESAREAN SECTION 02/2017: CORONARY ANGIOPLASTY WITH STENT PLACEMENT No date: LUNG LOBECTOMY; Right No date: PERICARDIAL WINDOW 1982: THYROIDECTOMY, PARTIAL     Reproductive/Obstetrics                             Anesthesia Physical  Anesthesia Plan  ASA: 3  Anesthesia Plan: Spinal   Post-op Pain Management:    Induction: Intravenous  PONV Risk Score and Plan: Treatment may vary due to age or medical condition, TIVA, Ondansetron, Propofol infusion and  Midazolam  Airway Management Planned: Nasal Cannula and Natural Airway  Additional Equipment:   Intra-op Plan:   Post-operative Plan:   Informed Consent: I have reviewed the patients History and Physical, chart, labs and discussed the procedure including the risks, benefits and alternatives for the proposed anesthesia with the patient or authorized representative who has indicated his/her understanding and acceptance.     Dental Advisory Given  Plan Discussed with: CRNA  Anesthesia Plan Comments: (Off Plavix (and ASA) for 1 week per pt. Confirmed directly with her.)        Anesthesia Quick Evaluation

## 2020-08-04 NOTE — Evaluation (Signed)
Physical Therapy Evaluation Patient Details Name: Nancy Combs MRN: [id number] DOB: [birth year] Today's Date: 08/04/2020   History of Present Illness  Pt is a 60 yo F diagnosed with progressive laxity status post left total knee arthroplasty and is s/p L TKA revision.  PMH includes: HTN, CAD, MI, COPD, lung CA s/p R lung and lobectomy.   Clinical Impression  Pt was in considerable pain to the L knee despite being pre-medicated prior to session but put forth good effort throughout.  Pt required only minimal assistance with bed mobility tasks to manage her LLE but required no physical assistance with transfers or gait.  Pt training provided on proper sequencing with transfers and gait and demonstrated good control and stability throughout.  Pt reported no adverse symptoms during the session other than L knee pain with SpO2 in the low 90s and HR WNL.  Pt will benefit from HHPT upon discharge to safely address deficits listed in patient problem list for decreased caregiver assistance and eventual return to PLOF.     Follow Up Recommendations Home health PT;Supervision for mobility/OOB    Equipment Recommendations  None recommended by PT    Recommendations for Other Services       Precautions / Restrictions Precautions Precautions: Fall Restrictions Weight Bearing Restrictions: Yes LLE Weight Bearing: Weight bearing as tolerated      Mobility  Bed Mobility Overal bed mobility: Needs Assistance Bed Mobility: Supine to Sit     Supine to sit: Min assist     General bed mobility comments: Min A to manage the LLE    Transfers Overall transfer level: Needs assistance Equipment used: Rolling walker (2 wheeled) Transfers: Sit to/from Stand Sit to Stand: Min guard         General transfer comment: Min verbal cues for sequencing but steady with good eccentric and concentric control  Ambulation/Gait Ambulation/Gait assistance: Min guard Gait Distance (Feet): 30 Feet Assistive  device: Rolling walker (2 wheeled) Gait Pattern/deviations: Step-to pattern;Antalgic;Decreased stance time - left Gait velocity: decreased   General Gait Details: Min to mod verbal and visual cues for proper sequencing with pt demonstrating good stability with no L knee buckling  Stairs            Wheelchair Mobility    Modified Rankin (Stroke Patients Only)       Balance Overall balance assessment: Needs assistance   Sitting balance-Leahy Scale: Normal     Standing balance support: Bilateral upper extremity supported;During functional activity Standing balance-Leahy Scale: Good                               Pertinent Vitals/Pain Pain Assessment: 0-10 Pain Score: 7  Pain Location: L knee Pain Descriptors / Indicators: Aching;Sore Pain Intervention(s): Premedicated before session;Monitored during session;Repositioned    Home Living Family/patient expects to be discharged to:: Private residence Living Arrangements: Children Available Help at Discharge: Family;Available 24 hours/day Type of Home: House Home Access: Ramped entrance     Home Layout: One level Home Equipment: Walker - 4 wheels;Walker - 2 wheels;Cane - single point;Bedside commode Additional Comments: Pt lives with three of her children and has 24/7 support    Prior Function Level of Independence: Independent with assistive device(s)         Comments: Ind amb in the home without an AD, SPC in the community, 3 falls in the last 6 months primarily from L knee buckling     Hand Dominance  Extremity/Trunk Assessment   Upper Extremity Assessment Upper Extremity Assessment: Overall WFL for tasks assessed    Lower Extremity Assessment Lower Extremity Assessment: Generalized weakness;LLE deficits/detail LLE Deficits / Details: BLE ankle AROM and strength WNL; BLE sensation to light touch WNL LLE: Unable to fully assess due to pain LLE Sensation: WNL       Communication    Communication: No difficulties  Cognition Arousal/Alertness: Awake/alert Behavior During Therapy: WFL for tasks assessed/performed Overall Cognitive Status: Within Functional Limits for tasks assessed                                        General Comments      Exercises Total Joint Exercises Ankle Circles/Pumps: AROM;Strengthening;Both;10 reps Quad Sets: AROM;Strengthening;Left;10 reps Long Arc Quad: AROM;Strengthening;Left;5 reps Knee Flexion: AROM;Strengthening;Left;5 reps Goniometric ROM: L knee AROM: 12-81 deg Marching in Standing: AROM;Strengthening;Both;5 reps Other Exercises Other Exercises: Car transfer sequencing verbal education Other Exercises: HEP education per handout Other Exercises: Positioning education to promote L knee ext PROM   Assessment/Plan    PT Assessment Patient needs continued PT services  PT Problem List Decreased strength;Decreased range of motion;Decreased activity tolerance;Decreased balance;Decreased mobility;Decreased knowledge of use of DME;Pain       PT Treatment Interventions DME instruction;Gait training;Functional mobility training;Therapeutic activities;Therapeutic exercise;Balance training;Patient/family education;Stair training    PT Goals (Current goals can be found in the Care Plan section)  Acute Rehab PT Goals Patient Stated Goal: To walk better PT Goal Formulation: With patient Time For Goal Achievement: 08/17/20 Potential to Achieve Goals: Good    Frequency BID   Barriers to discharge        Co-evaluation               AM-PAC PT "6 Clicks" Mobility  Outcome Measure Help needed turning from your back to your side while in a flat bed without using bedrails?: A Little Help needed moving from lying on your back to sitting on the side of a flat bed without using bedrails?: A Little Help needed moving to and from a bed to a chair (including a wheelchair)?: A Little Help needed standing up from a  chair using your arms (e.g., wheelchair or bedside chair)?: A Little Help needed to walk in hospital room?: A Little Help needed climbing 3-5 steps with a railing? : A Little 6 Click Score: 18    End of Session Equipment Utilized During Treatment: Gait belt Activity Tolerance: Patient tolerated treatment well Patient left: in chair;with nursing/sitter in room;Other (comment) (Pt left with nurse preparing for discharge) Nurse Communication: Mobility status PT Visit Diagnosis: Other abnormalities of gait and mobility (R26.89);Muscle weakness (generalized) (M62.81);Pain;History of falling (Z91.81) Pain - Right/Left: Left Pain - part of body: Knee    Time: 7616-0737 PT Time Calculation (min) (ACUTE ONLY): 39 min   Charges:   PT Evaluation $PT Eval Moderate Complexity: 1 Mod PT Treatments $Therapeutic Exercise: 8-22 mins $Therapeutic Activity: 8-22 mins        D. Royetta Asal PT, DPT 08/04/20, 5:04 PM

## 2020-08-05 ENCOUNTER — Encounter: Payer: Self-pay | Admitting: Surgery

## 2020-08-08 ENCOUNTER — Emergency Department: Payer: BC Managed Care – PPO

## 2020-08-08 ENCOUNTER — Encounter: Payer: Self-pay | Admitting: Internal Medicine

## 2020-08-08 ENCOUNTER — Inpatient Hospital Stay
Admission: EM | Admit: 2020-08-08 | Discharge: 2020-08-10 | DRG: 291 | Disposition: A | Discharge status: Home Health | Payer: BC Managed Care – PPO | Attending: Internal Medicine | Admitting: Internal Medicine

## 2020-08-08 ENCOUNTER — Other Ambulatory Visit: Payer: Self-pay

## 2020-08-08 DIAGNOSIS — Z7901 Long term (current) use of anticoagulants: Secondary | ICD-10-CM | POA: Diagnosis not present

## 2020-08-08 DIAGNOSIS — I5031 Acute diastolic (congestive) heart failure: Secondary | ICD-10-CM

## 2020-08-08 DIAGNOSIS — Z7984 Long term (current) use of oral hypoglycemic drugs: Secondary | ICD-10-CM

## 2020-08-08 DIAGNOSIS — J9601 Acute respiratory failure with hypoxia: Secondary | ICD-10-CM | POA: Diagnosis present

## 2020-08-08 DIAGNOSIS — D45 Polycythemia vera: Secondary | ICD-10-CM | POA: Diagnosis present

## 2020-08-08 DIAGNOSIS — E785 Hyperlipidemia, unspecified: Secondary | ICD-10-CM | POA: Diagnosis present

## 2020-08-08 DIAGNOSIS — N179 Acute kidney failure, unspecified: Secondary | ICD-10-CM | POA: Diagnosis present

## 2020-08-08 DIAGNOSIS — Z96652 Presence of left artificial knee joint: Secondary | ICD-10-CM | POA: Diagnosis present

## 2020-08-08 DIAGNOSIS — Z79899 Other long term (current) drug therapy: Secondary | ICD-10-CM | POA: Diagnosis not present

## 2020-08-08 DIAGNOSIS — Z955 Presence of coronary angioplasty implant and graft: Secondary | ICD-10-CM | POA: Diagnosis not present

## 2020-08-08 DIAGNOSIS — Z85118 Personal history of other malignant neoplasm of bronchus and lung: Secondary | ICD-10-CM | POA: Diagnosis not present

## 2020-08-08 DIAGNOSIS — E119 Type 2 diabetes mellitus without complications: Secondary | ICD-10-CM | POA: Diagnosis present

## 2020-08-08 DIAGNOSIS — I252 Old myocardial infarction: Secondary | ICD-10-CM | POA: Diagnosis not present

## 2020-08-08 DIAGNOSIS — J449 Chronic obstructive pulmonary disease, unspecified: Secondary | ICD-10-CM | POA: Diagnosis present

## 2020-08-08 DIAGNOSIS — I2510 Atherosclerotic heart disease of native coronary artery without angina pectoris: Secondary | ICD-10-CM | POA: Diagnosis present

## 2020-08-08 DIAGNOSIS — Z20822 Contact with and (suspected) exposure to covid-19: Secondary | ICD-10-CM | POA: Diagnosis present

## 2020-08-08 DIAGNOSIS — Z6841 Body Mass Index (BMI) 40.0 and over, adult: Secondary | ICD-10-CM

## 2020-08-08 DIAGNOSIS — Z833 Family history of diabetes mellitus: Secondary | ICD-10-CM

## 2020-08-08 DIAGNOSIS — Z902 Acquired absence of lung [part of]: Secondary | ICD-10-CM

## 2020-08-08 DIAGNOSIS — K219 Gastro-esophageal reflux disease without esophagitis: Secondary | ICD-10-CM | POA: Diagnosis present

## 2020-08-08 DIAGNOSIS — Z9981 Dependence on supplemental oxygen: Secondary | ICD-10-CM

## 2020-08-08 DIAGNOSIS — Z87891 Personal history of nicotine dependence: Secondary | ICD-10-CM

## 2020-08-08 DIAGNOSIS — R7989 Other specified abnormal findings of blood chemistry: Secondary | ICD-10-CM | POA: Diagnosis present

## 2020-08-08 DIAGNOSIS — I110 Hypertensive heart disease with heart failure: Secondary | ICD-10-CM | POA: Diagnosis present

## 2020-08-08 DIAGNOSIS — G4733 Obstructive sleep apnea (adult) (pediatric): Secondary | ICD-10-CM | POA: Diagnosis present

## 2020-08-08 DIAGNOSIS — J441 Chronic obstructive pulmonary disease with (acute) exacerbation: Secondary | ICD-10-CM

## 2020-08-08 DIAGNOSIS — I5033 Acute on chronic diastolic (congestive) heart failure: Secondary | ICD-10-CM | POA: Diagnosis present

## 2020-08-08 DIAGNOSIS — Z8249 Family history of ischemic heart disease and other diseases of the circulatory system: Secondary | ICD-10-CM

## 2020-08-08 DIAGNOSIS — E876 Hypokalemia: Secondary | ICD-10-CM | POA: Diagnosis present

## 2020-08-08 LAB — BASIC METABOLIC PANEL
Anion gap: 10 (ref 5–15)
BUN: 21 mg/dL — ABNORMAL HIGH (ref 6–20)
CO2: 27 mmol/L (ref 22–32)
Calcium: 9.3 mg/dL (ref 8.9–10.3)
Chloride: 101 mmol/L (ref 98–111)
Creatinine, Ser: 1.2 mg/dL — ABNORMAL HIGH (ref 0.44–1.00)
GFR, Estimated: 52 mL/min — ABNORMAL LOW (ref >60)
Glucose, Bld: 193 mg/dL — ABNORMAL HIGH (ref 70–99)
Potassium: 3.6 mmol/L (ref 3.5–5.1)
Sodium: 138 mmol/L (ref 135–145)

## 2020-08-08 LAB — RESP PANEL BY RT-PCR (FLU A&B, COVID) ARPGX2
Influenza A by PCR: NEGATIVE
Influenza B by PCR: NEGATIVE
SARS Coronavirus 2 by RT PCR: NEGATIVE

## 2020-08-08 LAB — CBC
HCT: 41.4 % (ref 36.0–46.0)
Hemoglobin: 13.7 g/dL (ref 12.0–15.0)
MCH: 29.5 pg (ref 26.0–34.0)
MCHC: 33.1 g/dL (ref 30.0–36.0)
MCV: 89.2 fL (ref 80.0–100.0)
Platelets: 302 10*3/uL (ref 150–400)
RBC: 4.64 MIL/uL (ref 3.87–5.11)
RDW: 13.2 % (ref 11.5–15.5)
WBC: 8.6 10*3/uL (ref 4.0–10.5)
nRBC: 0.4 % — ABNORMAL HIGH (ref 0.0–0.2)

## 2020-08-08 LAB — TROPONIN I (HIGH SENSITIVITY): Troponin I (High Sensitivity): 31 ng/L — ABNORMAL HIGH (ref <18)

## 2020-08-08 LAB — BRAIN NATRIURETIC PEPTIDE: B Natriuretic Peptide: 303.9 pg/mL — ABNORMAL HIGH (ref 0.0–100.0)

## 2020-08-08 MED ORDER — OXYCODONE HCL 5 MG PO TABS
5.0000 mg | ORAL_TABLET | ORAL | Status: DC | PRN
  Administered 2020-08-09 (×3): 10 mg via ORAL
  Filled 2020-08-08 (×3): qty 2

## 2020-08-08 MED ORDER — ACETAMINOPHEN 325 MG PO TABS: 650.0000 mg | ORAL_TABLET | Freq: Four times a day (QID) | ORAL | Status: DC | PRN

## 2020-08-08 MED ORDER — IOHEXOL 350 MG/ML SOLN
75.0000 mL | Freq: Once | INTRAVENOUS | Status: AC | PRN
  Administered 2020-08-08: 75 mL via INTRAVENOUS

## 2020-08-08 MED ORDER — PREDNISONE 20 MG PO TABS
60.0000 mg | ORAL_TABLET | Freq: Once | ORAL | Status: DC
  Filled 2020-08-08: qty 3

## 2020-08-08 MED ORDER — ONDANSETRON HCL 4 MG/2ML IJ SOLN: 4.0000 mg | Freq: Four times a day (QID) | INTRAMUSCULAR | Status: DC | PRN

## 2020-08-08 MED ORDER — IPRATROPIUM-ALBUTEROL 0.5-2.5 (3) MG/3ML IN SOLN
3.0000 mL | Freq: Once | RESPIRATORY_TRACT | Status: AC
  Administered 2020-08-08: 3 mL via RESPIRATORY_TRACT
  Filled 2020-08-08: qty 3

## 2020-08-08 MED ORDER — BUDESONIDE 0.25 MG/2ML IN SUSP
0.2500 mg | Freq: Two times a day (BID) | RESPIRATORY_TRACT | Status: DC
  Administered 2020-08-09 – 2020-08-10 (×3): 0.25 mg via RESPIRATORY_TRACT
  Filled 2020-08-08 (×3): qty 2

## 2020-08-08 MED ORDER — SODIUM CHLORIDE 0.9% FLUSH
3.0000 mL | Freq: Two times a day (BID) | INTRAVENOUS | Status: DC
  Administered 2020-08-09 – 2020-08-10 (×4): 3 mL via INTRAVENOUS

## 2020-08-08 MED ORDER — MORPHINE SULFATE (PF) 4 MG/ML IV SOLN
6.0000 mg | Freq: Once | INTRAVENOUS | Status: AC
  Administered 2020-08-08: 6 mg via INTRAVENOUS
  Filled 2020-08-08: qty 2

## 2020-08-08 MED ORDER — INSULIN ASPART 100 UNIT/ML IJ SOLN: 0.0000 [IU] | Freq: Three times a day (TID) | INTRAMUSCULAR | Status: DC

## 2020-08-08 MED ORDER — SODIUM CHLORIDE 0.9 % IV SOLN: 250.0000 mL | INTRAVENOUS | Status: DC | PRN

## 2020-08-08 MED ORDER — SODIUM CHLORIDE 0.9% FLUSH: 3.0000 mL | INTRAVENOUS | Status: DC | PRN

## 2020-08-08 MED ORDER — ONDANSETRON HCL 4 MG/2ML IJ SOLN
4.0000 mg | Freq: Once | INTRAMUSCULAR | Status: AC
  Administered 2020-08-08: 4 mg via INTRAVENOUS
  Filled 2020-08-08: qty 2

## 2020-08-08 MED ORDER — ASPIRIN EC 81 MG PO TBEC
81.0000 mg | DELAYED_RELEASE_TABLET | Freq: Every day | ORAL | Status: DC
  Administered 2020-08-09 – 2020-08-10 (×2): 81 mg via ORAL
  Filled 2020-08-08 (×2): qty 1

## 2020-08-08 MED ORDER — INSULIN ASPART 100 UNIT/ML IJ SOLN
0.0000 [IU] | Freq: Every day | INTRAMUSCULAR | Status: DC
Start: 2020-08-08 — End: 2020-08-10

## 2020-08-08 MED ORDER — INSULIN ASPART 100 UNIT/ML IJ SOLN
0.0000 [IU] | Freq: Three times a day (TID) | INTRAMUSCULAR | Status: DC
  Administered 2020-08-09 (×2): 3 [IU] via SUBCUTANEOUS
  Administered 2020-08-09: 5 [IU] via SUBCUTANEOUS
  Administered 2020-08-10 (×2): 3 [IU] via SUBCUTANEOUS
  Filled 2020-08-08 (×5): qty 1

## 2020-08-08 MED ORDER — APIXABAN 2.5 MG PO TABS
2.5000 mg | ORAL_TABLET | Freq: Two times a day (BID) | ORAL | Status: DC
  Administered 2020-08-09 – 2020-08-10 (×3): 2.5 mg via ORAL
  Filled 2020-08-08 (×5): qty 1

## 2020-08-08 MED ORDER — METOPROLOL SUCCINATE ER 50 MG PO TB24
100.0000 mg | ORAL_TABLET | Freq: Every day | ORAL | Status: DC
  Administered 2020-08-09: 21:00:00 100 mg via ORAL
  Filled 2020-08-08: qty 2

## 2020-08-08 NOTE — ED Triage Notes (Signed)
Pt here with hypoxia. Pt had surgery last Thurs and has been dealing with the hypoxia since. Sats run between 84-92. Pt has COPD and has recently quit smoking.

## 2020-08-08 NOTE — Anesthesia Postprocedure Evaluation (Addendum)
Anesthesia Post Note  Patient: [employer]er, [employer]  Procedure(s) Performed: POLYETHYLENE EXCHANGE OF LEFT KNEE (Left: Knee)  Patient location during evaluation: PACU Anesthesia Type: Spinal Level of consciousness: awake and alert Pain management: pain level controlled Vital Signs Assessment: post-procedure vital signs reviewed and stable Respiratory status: spontaneous breathing and respiratory function stable Cardiovascular status: blood pressure returned to baseline and stable Postop Assessment: spinal receding Anesthetic complications: no   No notable events documented.   Last Vitals:  Vitals:   08/04/20 1651 08/04/20 1727  BP: (!) 146/76 (!) 158/81  Pulse: 62 65  Resp: 16   Temp: 36.7 C   SpO2: 91% 92%    Last Pain:  Vitals:   08/04/20 1651  TempSrc: Temporal  PainSc:                  Martha Clan

## 2020-08-08 NOTE — H&P (Signed)
History and Physical    Nancy Combs LZJ:[id number] DOB: [birth year] DOA: 08/08/2020  PCP: Hortencia Pilar, MD    Patient coming from:  Home    Chief Complaint:  Hypoxia.    HPI: Nancy Combs is a 60 y.o. female seen in ed with complaints hypoxia at home.  Per patient this has been going on for the past 2 days and when she saw that her O2 sats were in the mid 70s her son asked her to come to the emergency room.  Patient did not report any worsening of her shortness of breath that is new however she did note the hypoxia and was concerned. Pt just had knee replacement on July 16, 2020 and has been on Eliquis. Hypoxia since Thursday after her knee procedure was advised to monitor her oxygen, Sunday she was talking to her son and then son said go to ER.  Pt has past medical history of chronic kidney disease, COPD on home O2, former smoker,, diabetes mellitus type 2, hypertension, hyperlipidemia, heart disease, polycythemia vera, chronic anticoagulation. ED Course:  Vitals:   08/08/20 1930 08/08/20 2000 08/08/20 2105 08/08/20 2130  BP: 131/65 125/73 (!) 147/59 (!) 163/63  Pulse: 63 72 74 74  Resp: 19 19 18    Temp:      TempSrc:      SpO2: 93% 98% 96% 95%  Weight:      Height:      In the emergency room patient is alert awake oriented afebrile normotensive, 92% on room air started on 2 L nasal cannula where patient stayed above 93% and currently 95% on 2 L. Initial EKG shows sinus rhythm at 67, QTc 462 no ST-T wave changes.  CMP shows glucose of 193, creatinine of 1.20, BNP of 303.9, troponin of 31.CTA negative for PE and pulmonary vascular congestion on ct. Covid pending.    Review of Systems:  Review of Systems  All other systems reviewed and are negative.   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     COPD (chronic obstructive pulmonary disease) (HCC)    Diabetes mellitus without complication (HCC)    Dyspnea    HLD (hyperlipidemia)    Hyperlipidemia    Hypertension    MI (myocardial infarction) ([place]) 02/2017   PCI with DES x 1 to RCA   PV (polycythemia vera) ([place])     Past Surgical History:  Procedure Laterality Date   CESAREAN SECTION     CORONARY ANGIOPLASTY WITH STENT PLACEMENT  02/17/2017   4.0 x 18 mm Xience DES to RCA   LUNG LOBECTOMY Right 2016   PERICARDIAL WINDOW  2005   THYROIDECTOMY, PARTIAL  1982   TOTAL KNEE ARTHROPLASTY Left 11/19/2019   Procedure: TOTAL KNEE ARTHROPLASTY;  Surgeon: Corky Mull, MD;  Location: [facility] ORS;  Service: Orthopedics;  Laterality: Left;   TOTAL KNEE REVISION Left 08/04/2020   Procedure: POLYETHYLENE EXCHANGE OF LEFT KNEE;  Surgeon: Corky Mull, MD;  Location: [facility] ORS;  Service: Orthopedics;  Laterality: Left;     reports that she quit smoking about a year ago. Her smoking use included cigarettes. She smoked an average of .5 packs per day. She has never used smokeless tobacco. She reports that she does not drink alcohol and does not use drugs.  No  Known Allergies  Family History  Problem Relation Age of Onset   Diabetes Mother    Hypertension Mother     Prior to Admission medications   Medication Sig Start Date End Date Taking? Authorizing Provider  acetaminophen (TYLENOL) 325 MG tablet Take 650 mg by mouth every 6 (six) hours as needed for moderate pain.    [provider]  amLODipine (NORVASC) 10 MG tablet Take 1 tablet (10 mg total) by mouth daily. 11/13/17   Katy Apo, NP  apixaban (ELIQUIS) 2.5 MG TABS tablet Take 1 tablet (2.5 mg total) by mouth 2 (two) times daily. 08/05/20   Poggi, Marshall Cork, MD  folic acid (FOLVITE) 1 MG tablet Take 1 mg by mouth daily.    [provider]  glipiZIDE (GLUCOTROL) 5 MG tablet Take 5 mg by mouth 2 (two) times daily at 8 am and 10 pm. 10/21/19   [provider]   hydrochlorothiazide (HYDRODIURIL) 25 MG tablet Take 25 mg by mouth daily.    [provider]  losartan (COZAAR) 100 MG tablet Take 1 tablet (100 mg total) by mouth daily. 11/13/17   Katy Apo, NP  metFORMIN (GLUCOPHAGE-XR) 500 MG 24 hr tablet Take 500-1,000 mg by mouth See admin instructions. Take 500 mg by mouth in the morning and 1000 mg in the evening 09/29/19   [provider]  metoprolol succinate (TOPROL-XL) 100 MG 24 hr tablet Take 1 tablet (100 mg total) by mouth daily. Take with or immediately following a meal. Patient taking differently: Take 100 mg by mouth at bedtime. Take with or immediately following a meal. 11/13/17   Amyot, Nicholes Stairs, NP  oxyCODONE (ROXICODONE) 5 MG immediate release tablet Take 1-2 tablets (5-10 mg total) by mouth every 4 (four) hours as needed for moderate pain or severe pain. 08/04/20   Poggi, Marshall Cork, MD  simvastatin (ZOCOR) 20 MG tablet Take 1 tablet (20 mg total) by mouth daily. 11/13/17   Katy Apo, NP  Tiotropium Bromide-Olodaterol (STIOLTO RESPIMAT) 2.5-2.5 MCG/ACT AERS Inhale 2 puffs into the lungs daily. 11/13/17   Katy Apo, NP  varenicline (CHANTIX) 1 MG tablet Take 1 mg by mouth 2 (two) times daily.    [provider]    Physical Exam: Vitals:   08/08/20 1930 08/08/20 2000 08/08/20 2105 08/08/20 2130  BP: 131/65 125/73 (!) 147/59 (!) 163/63  Pulse: 63 72 74 74  Resp: 19 19 18    Temp:      TempSrc:      SpO2: 93% 98% 96% 95%  Weight:      Height:       Physical Exam Vitals reviewed.  Constitutional:      General: She is not in acute distress.    Appearance: She is obese. She is not ill-appearing.  HENT:     Head: Normocephalic and atraumatic.     Right Ear: External ear normal.     Left Ear: External ear normal.     Nose: Nose normal.     Mouth/Throat:     Mouth: Mucous membranes are moist.  Eyes:     Extraocular Movements: Extraocular movements intact.  Neck:     Vascular: No carotid  bruit.  Cardiovascular:     Rate and Rhythm: Normal rate and regular rhythm.     Pulses: Normal pulses.     Heart sounds: Normal heart sounds.  Pulmonary:     Effort: Pulmonary effort is normal.     Breath sounds:  Normal breath sounds.  Abdominal:     General: Bowel sounds are normal. There is no distension.     Palpations: Abdomen is soft.     Tenderness: There is no abdominal tenderness. There is no guarding.  Musculoskeletal:     Right lower leg: No edema.     Left lower leg: No edema.  Skin:    General: Skin is warm.  Neurological:     General: No focal deficit present.     Mental Status: She is alert.  Psychiatric:        Mood and Affect: Mood normal.        Behavior: Behavior normal.   Labs on Admission: I have personally reviewed following labs and imaging studies  No results for input(s): CKTOTAL, CKMB, TROPONINI in the last 72 hours. Lab Results  Component Value Date   WBC 8.6 08/08/2020   HGB 13.7 08/08/2020   HCT 41.4 08/08/2020   MCV 89.2 08/08/2020   PLT 302 08/08/2020    Recent Labs  Lab 08/08/20 1201  NA 138  K 3.6  CL 101  CO2 27  BUN 21*  CREATININE 1.20*  CALCIUM 9.3  GLUCOSE 193*   No results found for: CHOL, HDL, LDLCALC, TRIG No results found for: DDIMER Invalid input(s): POCBNP  Urinalysis    Component Value Date/Time   COLORURINE YELLOW (A) 07/26/2020 0922   APPEARANCEUR HAZY (A) 07/26/2020 0922   LABSPEC 1.021 07/26/2020 0922   PHURINE 5.0 07/26/2020 0922   GLUCOSEU >=500 (A) 07/26/2020 0922   HGBUR NEGATIVE 07/26/2020 0922   BILIRUBINUR NEGATIVE 07/26/2020 0922   KETONESUR NEGATIVE 07/26/2020 0922   PROTEINUR 100 (A) 07/26/2020 0922   NITRITE NEGATIVE 07/26/2020 0922   LEUKOCYTESUR NEGATIVE 07/26/2020 0922   COVID-19 Labs No results for input(s): DDIMER, FERRITIN, LDH, CRP in the last 72 hours. Lab Results  Component Value Date   [place] NEGATIVE 08/02/2020   [place] NEGATIVE 11/17/2019    Radiological Exams on  Admission: DG Chest 2 View  Result Date: 08/08/2020 CLINICAL DATA:  Shortness of breath. EXAM: CHEST - 2 VIEW COMPARISON:  None. FINDINGS: Enlarged cardiac silhouette. Calcific atherosclerosis of the aorta. Central pulmonary vascular congestion. Possible small bilateral pleural effusions. No visible pneumothorax. Bibasilar opacities. Remote appearing right rib fracture. Clips project in the right hilar and right chest region. IMPRESSION: 1. Cardiomegaly with pulmonary vascular congestion and possible small bilateral pleural effusions. 2. Bibasilar opacities, which could represent atelectasis, aspiration, and/or pneumonia. Electronically Signed   By: Margaretha Sheffield MD   On: 08/08/2020 12:59   CT Angio Chest PE W and/or Wo Contrast  Result Date: 08/08/2020 CLINICAL DATA:  Hypoxia, surgery last Thursday (total knee revision), recently quit smoking EXAM: CT ANGIOGRAPHY CHEST WITH CONTRAST TECHNIQUE: Multidetector CT imaging of the chest was performed using the standard protocol during bolus administration of intravenous contrast. Multiplanar CT image reconstructions and MIPs were obtained to evaluate the vascular anatomy. CONTRAST:  10mL OMNIPAQUE IOHEXOL 350 MG/ML SOLN COMPARISON:  Radiograph 08/08/2020 FINDINGS: Cardiovascular: Satisfactory opacification of the pulmonary arteries to the segmental level. Evaluation somewhat limited due to extensive respiratory motion artifact which is most pronounced towards the lung bases. No convincing central, lobar proximal segmental filling defects are identified within the limitations of this exam. Cardiomegaly is noted. No pericardial effusion. Coronary artery atherosclerosis is seen as well as a probable stent right coronary. Atherosclerotic plaque within the normal caliber aorta. Insert aortic root No acute luminal abnormality of the imaged aorta. No periaortic stranding  or hemorrhage. Normal 3 vessel branching of the aortic arch with calcific plaque throughout the  proximal great vessels. No major venous abnormalities or significant venous reflux is seen. Mediastinum/Nodes: No mediastinal fluid or gas. Normal thyroid gland and thoracic inlet. No acute abnormality of the trachea or esophagus. Surgical clips noted along the right mediastinum compatible with prior surgical truncation of the airways and vessels from prior lobectomy. No worrisome mediastinal, hilar or axillary adenopathy. Lungs/Pleura: Postsurgical changes from what appears to be a prior right upper lobectomy. Some compensatory hyperinflation changes of the right lung are seen. There are dependent atelectatic changes throughout the lungs with more patchy ground-glass opacity seen dependently with some fissural and and minimal septal thickening as well as small bilateral effusions which could reflect developing interstitial and alveolar edema. Infection or aspiration less favored. No pneumothorax. Upper Abdomen: No acute abnormalities present in the visualized portions of the upper abdomen. Diffuse hepatic hypoattenuation compatible with hepatic steatosis. Musculoskeletal: Several chronic right rib deformities including partial resection of the right posterior sixth rib compatible with postsurgical features from prior right lobectomy. No acute or worrisome osseous abnormality. No suspicious chest wall mass or lesion. Review of the MIP images confirms the above findings. IMPRESSION: 1. Satisfactory opacification of pulmonary arteries with exam quality limited by respiratory motion artifact. No large central, lobar proximal segmental filling defects are identified. 2. Dependent atelectasis is present with additional posterior ground-glass opacity in the lungs, left greater than right with fissural thickening and mild septal thickening, suggestive of some degree of edema with infection or aspiration less favored. 3. Postsurgical changes from prior right lobectomy with surgical changes of the chest wall and along the  right mediastinum. Some compensatory hyperinflation of the remaining right lung parenchyma. 4.  Aortic Atherosclerosis (ICD10-I70.0). 5. Coronary artery atherosclerosis and coronary stenting. Electronically Signed   By: Lovena Le M.D.   On: 08/08/2020 20:55    EKG: Independently reviewed.  Initial EKG shows sinus rhythm at 67, QTc 462 no ST-T wave changes.   Assessment/Plan Principal Problem:   Acute respiratory failure with hypoxia ([place]): Admit pt for hypoxia at 80%, and evaluation. We will continue Done[place] t/t and start IS q 4 hours. We will also obtain an echo to evaluate cardiac function as pt is having elevated BNP., although I suspect atelectasis.    COPD (chronic obstructive pulmonary disease) ([place]): We will cont MDI PRN. Inhaled pulmicort.     Atherosclerotic heart disease of native coronary artery without angina pectoris: No chest pain, mild troponin elevation. 2 d echo./ cycle troponin.     Diabetes mellitus without complication ([place]): SSI / glycemic protocol. Last a1c was 8.3 last week per patient.    GERD (gastroesophageal reflux disease): Iv ppi.    AKI (acute kidney injury) [facility]): Elevated creatinine Lab Results  Component Value Date   CREATININE 1.20 (H) 08/08/2020   CREATININE 0.71 07/26/2020   CREATININE 0.85 11/12/2019  We will hold ARB/ metformin and diuretic.     DVT prophylaxis:  SCD's   Code Status:  Full code    Family Communication:  Gemmer,alex (Son)  [phone] (Mobile)   Disposition Plan:  Home    Consults called:  None   Admission status: Inpatient     Para Skeans MD [facility] [phone] How to contact the [facility] Attending or Consulting provider [place] or covering provider during after hours [place], for this patient.    Check the care team in [facility] and look for a) attending/consulting  [place] provider listed and b) the [facility] team listed Log into [email or web address] and use [place]'s universal password to access. If you do  not have the password, please contact the hospital operator. Locate the [facility] provider you are looking for under [facility] and page to a number that you can be directly reached. If you still have difficulty reaching the provider, please page the [facility] (Director on Call) for the Hospitalists listed on amion for assistance. [email or web address] Password [facility] 08/08/2020, 10:01 PM

## 2020-08-08 NOTE — ED Notes (Signed)
Pt oxygen sats at 91%. Placed on 2L BNC.

## 2020-08-08 NOTE — ED Provider Notes (Signed)
[facility] Emergency Department Provider Note  ____________________________________________   Event Date/Time   First MD Initiated Contact with Patient 08/08/20 1823     (approximate)  I have reviewed the triage vital signs and the nursing notes.   HISTORY  Chief Complaint Hypoxia    HPI Nancy Combs is a 60 y.o. female  with PMHx CKD, COPD, DM, HTN, HLD, here with SOB. Pt just had a L knee replacementon 7/21. Since then, she's had persistent but not worseing L knee pain, swelling, similar to her prior replacements. Over the last 2 days, she has begun to notice that her saturations have been in the mid 80s, which is new for her. She has a h/o CHF, COPD and thought it was related to this. She told her son about this, and he told her to come to the ED. Reports she has some chronic SOB from her COPD but this is not acutely worsened. No known h/o DVT/PE. She has been on Eliquis post-op. No CP. No significant swelling other than her knee.       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   Past Surgical History:  Procedure Laterality Date   CESAREAN SECTION     CORONARY ANGIOPLASTY WITH STENT PLACEMENT  02/17/2017   4.0 x 18 mm Xience DES to RCA   LUNG LOBECTOMY Right 2016   PERICARDIAL WINDOW  2005   THYROIDECTOMY, PARTIAL  1982   TOTAL KNEE ARTHROPLASTY Left 11/19/2019   Procedure: TOTAL KNEE ARTHROPLASTY;  Surgeon: Corky Mull, MD;  Location:  [facility] ORS;  Service: Orthopedics;  Laterality: Left;   TOTAL KNEE REVISION Left 08/04/2020   Procedure: POLYETHYLENE EXCHANGE OF LEFT KNEE;  Surgeon: Corky Mull, MD;  Location: [facility] ORS;  Service: Orthopedics;  Laterality: Left;    Prior to Admission medications   Medication Sig Start Date End Date Taking? Authorizing Provider  acetaminophen (TYLENOL) 325 MG tablet Take 650 mg by mouth every 6 (six) hours as needed for moderate pain.    [provider]  amLODipine (NORVASC) 10 MG tablet Take 1 tablet (10 mg total) by mouth daily. 11/13/17   Katy Apo, NP  apixaban (ELIQUIS) 2.5 MG TABS tablet Take 1 tablet (2.5 mg total) by mouth 2 (two) times daily. 08/05/20   Poggi, Marshall Cork, MD  folic acid (FOLVITE) 1 MG tablet Take 1 mg by mouth daily.    [provider]  glipiZIDE (GLUCOTROL) 5 MG tablet Take 5 mg by mouth 2 (two) times daily at 8 am and 10 pm. 10/21/19   [provider]  hydrochlorothiazide (HYDRODIURIL) 25 MG tablet Take 25 mg by mouth daily.    [provider]  losartan (COZAAR) 100 MG tablet Take 1 tablet (100 mg total) by mouth daily. 11/13/17   Katy Apo, NP  metFORMIN (GLUCOPHAGE-XR) 500 MG 24 hr tablet  Take 500-1,000 mg by mouth See admin instructions. Take 500 mg by mouth in the morning and 1000 mg in the evening 09/29/19   [provider]  metoprolol succinate (TOPROL-XL) 100 MG 24 hr tablet Take 1 tablet (100 mg total) by mouth daily. Take with or immediately following a meal. Patient taking differently: Take 100 mg by mouth at bedtime. Take with or immediately following a meal. 11/13/17   Amyot, Nicholes Stairs, NP  oxyCODONE (ROXICODONE) 5 MG immediate release tablet Take 1-2 tablets (5-10 mg total) by mouth every 4 (four) hours as needed for moderate pain or severe pain. 08/04/20   Poggi, Marshall Cork, MD  simvastatin (ZOCOR) 20 MG tablet Take 1 tablet (20 mg total) by mouth daily. 11/13/17   Katy Apo, NP  Tiotropium  Bromide-Olodaterol (STIOLTO RESPIMAT) 2.5-2.5 MCG/ACT AERS Inhale 2 puffs into the lungs daily. 11/13/17   Katy Apo, NP  varenicline (CHANTIX) 1 MG tablet Take 1 mg by mouth 2 (two) times daily.    [provider]    Allergies Patient has no known allergies.  Family History  Problem Relation Age of Onset   Diabetes Mother    Hypertension Mother     Social History Social History   Tobacco Use   Smoking status: Former    Packs/day: 0.50    Types: Cigarettes    Quit date: 08/26/2019    Years since quitting: 0.9   Smokeless tobacco: Never  Vaping Use   Vaping Use: Never used  Substance Use Topics   Alcohol use: Never   Drug use: Never    Review of Systems  Review of Systems  Constitutional:  Positive for fatigue. Negative for chills and fever.  HENT:  Negative for sore throat.   Respiratory:  Positive for shortness of breath.   Cardiovascular:  Negative for chest pain.  Gastrointestinal:  Negative for abdominal pain.  Genitourinary:  Negative for flank pain.  Musculoskeletal:  Positive for arthralgias and joint swelling. Negative for neck pain.  Skin:  Negative for rash and wound.  Allergic/Immunologic: Negative for immunocompromised state.  Neurological:  Negative for weakness and numbness.  Hematological:  Does not bruise/bleed easily.    ____________________________________________  PHYSICAL EXAM:      VITAL SIGNS: ED Triage Vitals  Enc Vitals Group     BP 08/08/20 1152 (!) 157/86     Pulse Rate 08/08/20 1152 69     Resp 08/08/20 1152 20     Temp 08/08/20 1152 98.3 F (36.8 C)     Temp Source 08/08/20 1152 Oral     SpO2 08/08/20 1152 92 %     Weight 08/08/20 1153 235 lb (106.6 kg)     Height 08/08/20 1153 5\' 2"  (1.575 m)     Head Circumference --      Peak Flow --      Pain Score 08/08/20 1153 0     Pain Loc --      Pain Edu? --      Excl. in [place]? --      Physical Exam Vitals and nursing note reviewed.  Constitutional:       General: She is not in acute distress.    Appearance: She is well-developed.  HENT:     Head: Normocephalic and atraumatic.  Eyes:     Conjunctiva/sclera: Conjunctivae normal.  Cardiovascular:     Rate and Rhythm: Normal rate and regular rhythm.     Heart sounds: Normal heart sounds.  Pulmonary:  Effort: Pulmonary effort is normal. Tachypnea present. No respiratory distress.     Breath sounds: Wheezing present.  Abdominal:     General: There is no distension.  Musculoskeletal:     Cervical back: Neck supple.     Comments: Moderate L knee tenderness, joint swelling. No deformity. No warmth. No popliteal tenderness.  Skin:    General: Skin is warm.     Capillary Refill: Capillary refill takes less than 2 seconds.     Findings: No rash.  Neurological:     Mental Status: She is alert and oriented to person, place, and time.     Motor: No abnormal muscle tone.      ____________________________________________   LABS (all labs ordered are listed, but only abnormal results are displayed)  Labs Reviewed  CBC - Abnormal; Notable for the following components:      Result Value   nRBC 0.4 (*)    All other components within normal limits  BASIC METABOLIC PANEL - Abnormal; Notable for the following components:   Glucose, Bld 193 (*)    BUN 21 (*)    Creatinine, Ser 1.20 (*)    GFR, Estimated 52 (*)    All other components within normal limits  BRAIN NATRIURETIC PEPTIDE - Abnormal; Notable for the following components:   B Natriuretic Peptide 303.9 (*)    All other components within normal limits  TROPONIN I (HIGH SENSITIVITY) - Abnormal; Notable for the following components:   Troponin I (High Sensitivity) 31 (*)    All other components within normal limits    ____________________________________________  EKG: Normal sinus rhythm, VR 67. PR 214, QRS 100, QTc 462. Rightward axis. No acute ST elevations. ________________________________________  RADIOLOGY All imaging,  including plain films, CT scans, and ultrasounds, independently reviewed by me, and interpretations confirmed via formal radiology reads.  ED MD interpretation:   CXR: Bibasilar opacities, cardiomegaly w/ pulmonary vascular congestion CT Angio: No pE, atelectasis, ? Edema, postsurgical changes, CAD  Official radiology report(s): DG Chest 2 View  Result Date: 08/08/2020 CLINICAL DATA:  Shortness of breath. EXAM: CHEST - 2 VIEW COMPARISON:  None. FINDINGS: Enlarged cardiac silhouette. Calcific atherosclerosis of the aorta. Central pulmonary vascular congestion. Possible small bilateral pleural effusions. No visible pneumothorax. Bibasilar opacities. Remote appearing right rib fracture. Clips project in the right hilar and right chest region. IMPRESSION: 1. Cardiomegaly with pulmonary vascular congestion and possible small bilateral pleural effusions. 2. Bibasilar opacities, which could represent atelectasis, aspiration, and/or pneumonia. Electronically Signed   By: Margaretha Sheffield MD   On: 08/08/2020 12:59   CT Angio Chest PE W and/or Wo Contrast  Result Date: 08/08/2020 CLINICAL DATA:  Hypoxia, surgery last Thursday (total knee revision), recently quit smoking EXAM: CT ANGIOGRAPHY CHEST WITH CONTRAST TECHNIQUE: Multidetector CT imaging of the chest was performed using the standard protocol during bolus administration of intravenous contrast. Multiplanar CT image reconstructions and MIPs were obtained to evaluate the vascular anatomy. CONTRAST:  78mL OMNIPAQUE IOHEXOL 350 MG/ML SOLN COMPARISON:  Radiograph 08/08/2020 FINDINGS: Cardiovascular: Satisfactory opacification of the pulmonary arteries to the segmental level. Evaluation somewhat limited due to extensive respiratory motion artifact which is most pronounced towards the lung bases. No convincing central, lobar proximal segmental filling defects are identified within the limitations of this exam. Cardiomegaly is noted. No pericardial effusion.  Coronary artery atherosclerosis is seen as well as a probable stent right coronary. Atherosclerotic plaque within the normal caliber aorta. Insert aortic root No acute luminal abnormality of the imaged aorta.  No periaortic stranding or hemorrhage. Normal 3 vessel branching of the aortic arch with calcific plaque throughout the proximal great vessels. No major venous abnormalities or significant venous reflux is seen. Mediastinum/Nodes: No mediastinal fluid or gas. Normal thyroid gland and thoracic inlet. No acute abnormality of the trachea or esophagus. Surgical clips noted along the right mediastinum compatible with prior surgical truncation of the airways and vessels from prior lobectomy. No worrisome mediastinal, hilar or axillary adenopathy. Lungs/Pleura: Postsurgical changes from what appears to be a prior right upper lobectomy. Some compensatory hyperinflation changes of the right lung are seen. There are dependent atelectatic changes throughout the lungs with more patchy ground-glass opacity seen dependently with some fissural and and minimal septal thickening as well as small bilateral effusions which could reflect developing interstitial and alveolar edema. Infection or aspiration less favored. No pneumothorax. Upper Abdomen: No acute abnormalities present in the visualized portions of the upper abdomen. Diffuse hepatic hypoattenuation compatible with hepatic steatosis. Musculoskeletal: Several chronic right rib deformities including partial resection of the right posterior sixth rib compatible with postsurgical features from prior right lobectomy. No acute or worrisome osseous abnormality. No suspicious chest wall mass or lesion. Review of the MIP images confirms the above findings. IMPRESSION: 1. Satisfactory opacification of pulmonary arteries with exam quality limited by respiratory motion artifact. No large central, lobar proximal segmental filling defects are identified. 2. Dependent atelectasis is  present with additional posterior ground-glass opacity in the lungs, left greater than right with fissural thickening and mild septal thickening, suggestive of some degree of edema with infection or aspiration less favored. 3. Postsurgical changes from prior right lobectomy with surgical changes of the chest wall and along the right mediastinum. Some compensatory hyperinflation of the remaining right lung parenchyma. 4.  Aortic Atherosclerosis (ICD10-I70.0). 5. Coronary artery atherosclerosis and coronary stenting. Electronically Signed   By: Lovena Le M.D.   On: 08/08/2020 20:55    ____________________________________________  PROCEDURES   Procedure(s) performed (including Critical Care):  Procedures  ____________________________________________  INITIAL IMPRESSION / MDM / [place] / ED COURSE  As part of my medical decision making, I reviewed the following data within the [place] notes reviewed and incorporated, Old chart reviewed, Notes from prior ED visits, and [place] Controlled Substance Database       *Nancy Combs was evaluated in Emergency Department on 08/08/2020 for the symptoms described in the history of present illness. She was evaluated in the context of the global COVID-19 pandemic, which necessitated consideration that the patient might be at risk for infection with the SARS-CoV-2 virus that causes COVID-19. Institutional protocols and algorithms that pertain to the evaluation of patients at risk for COVID-19 are in a state of rapid change based on information released by regulatory bodies including the CDC and federal and state organizations. These policies and algorithms were followed during the patient's care in the ED.  Some ED evaluations and interventions may be delayed as a result of limited staffing during the pandemic.*     Medical Decision Making:  60 yo F here with hypoxia, SOB in setting of recent knee replacement. On arrival,  pt hypoxic, wheeznig c/w likely COPD exacerbation. There may also be a component of edema. Labs show slightly elevated Cr, BUN and trop. Suspect component of mild edema. CXR shows edema w/ bbasilar opacities. Given recent surgery, CT angio obtained, reviewed, and is neg for PE but does show fluid, atelectasis. Will admit for treatment, likely diuresis though holding at  this time given recent Cr load. Pt updated and in agreement.  ____________________________________________  FINAL CLINICAL IMPRESSION(S) / ED DIAGNOSES  Final diagnoses:  Acute respiratory failure with hypoxia ([place])     MEDICATIONS GIVEN DURING THIS VISIT:  Medications  predniSONE (DELTASONE) tablet 60 mg (has no administration in time range)  ipratropium-albuterol (DUONEB) 0.5-2.5 (3) MG/3ML nebulizer solution 3 mL (3 mLs Nebulization Given 08/08/20 1928)  ipratropium-albuterol (DUONEB) 0.5-2.5 (3) MG/3ML nebulizer solution 3 mL (3 mLs Nebulization Given 08/08/20 1928)  ipratropium-albuterol (DUONEB) 0.5-2.5 (3) MG/3ML nebulizer solution 3 mL (3 mLs Nebulization Given 08/08/20 1928)  iohexol (OMNIPAQUE) 350 MG/ML injection 75 mL (75 mLs Intravenous Contrast Given 08/08/20 2026)  morphine 4 MG/ML injection 6 mg (6 mg Intravenous Given 08/08/20 2101)  ondansetron (ZOFRAN) injection 4 mg (4 mg Intravenous Given 08/08/20 2101)     ED Discharge Orders     None        Note:  This document was prepared using Dragon voice recognition software and may include unintentional dictation errors.   Duffy Bruce, MD 08/08/20 2144

## 2020-08-09 ENCOUNTER — Other Ambulatory Visit: Payer: Self-pay

## 2020-08-09 ENCOUNTER — Encounter: Payer: Self-pay | Admitting: Internal Medicine

## 2020-08-09 ENCOUNTER — Inpatient Hospital Stay
Admit: 2020-08-09 | Discharge: 2020-08-09 | Disposition: A | Discharge status: Home / Self Care | Payer: BC Managed Care – PPO | Attending: Internal Medicine | Admitting: Internal Medicine

## 2020-08-09 DIAGNOSIS — I5031 Acute diastolic (congestive) heart failure: Secondary | ICD-10-CM | POA: Diagnosis not present

## 2020-08-09 DIAGNOSIS — J441 Chronic obstructive pulmonary disease with (acute) exacerbation: Secondary | ICD-10-CM | POA: Diagnosis not present

## 2020-08-09 DIAGNOSIS — J9601 Acute respiratory failure with hypoxia: Secondary | ICD-10-CM | POA: Diagnosis not present

## 2020-08-09 DIAGNOSIS — E119 Type 2 diabetes mellitus without complications: Secondary | ICD-10-CM

## 2020-08-09 LAB — AEROBIC/ANAEROBIC CULTURE W GRAM STAIN (SURGICAL/DEEP WOUND): Culture: NO GROWTH

## 2020-08-09 LAB — GLUCOSE, CAPILLARY
Glucose-Capillary: 167 mg/dL — ABNORMAL HIGH (ref 70–99)
Glucose-Capillary: 145 mg/dL — ABNORMAL HIGH (ref 70–99)

## 2020-08-09 LAB — ECHOCARDIOGRAM COMPLETE
Height: 62 in
S' Lateral: 1.67 cm
Weight: 3760 oz

## 2020-08-09 LAB — PROCALCITONIN: Procalcitonin: <0.1 ng/mL

## 2020-08-09 LAB — BASIC METABOLIC PANEL
Anion gap: 8 (ref 5–15)
BUN: 17 mg/dL (ref 6–20)
CO2: 30 mmol/L (ref 22–32)
Calcium: 8.9 mg/dL (ref 8.9–10.3)
Chloride: 101 mmol/L (ref 98–111)
Creatinine, Ser: 0.98 mg/dL (ref 0.44–1.00)
GFR, Estimated: >60 mL/min (ref >60)
Glucose, Bld: 161 mg/dL — ABNORMAL HIGH (ref 70–99)
Potassium: 3.3 mmol/L — ABNORMAL LOW (ref 3.5–5.1)
Sodium: 139 mmol/L (ref 135–145)

## 2020-08-09 LAB — CBG MONITORING, ED
Glucose-Capillary: 162 mg/dL — ABNORMAL HIGH (ref 70–99)
Glucose-Capillary: 233 mg/dL — ABNORMAL HIGH (ref 70–99)

## 2020-08-09 LAB — HIV ANTIBODY (ROUTINE TESTING W REFLEX): HIV Screen 4th Generation wRfx: NONREACTIVE

## 2020-08-09 MED ORDER — GLIPIZIDE 5 MG PO TABS: 5.0000 mg | ORAL_TABLET | Freq: Two times a day (BID) | ORAL | Status: DC

## 2020-08-09 MED ORDER — ORAL CARE MOUTH RINSE
15.0000 mL | Freq: Two times a day (BID) | OROMUCOSAL | Status: DC
  Administered 2020-08-09 – 2020-08-10 (×2): 15 mL via OROMUCOSAL

## 2020-08-09 MED ORDER — AZITHROMYCIN 500 MG PO TABS
500.0000 mg | ORAL_TABLET | Freq: Every day | ORAL | Status: AC
  Administered 2020-08-09: 500 mg via ORAL
  Filled 2020-08-09: qty 1

## 2020-08-09 MED ORDER — AZITHROMYCIN 500 MG PO TABS
250.0000 mg | ORAL_TABLET | Freq: Every day | ORAL | Status: DC
Start: 2020-08-10 — End: 2020-08-10
  Administered 2020-08-10: 250 mg via ORAL
  Filled 2020-08-09: qty 1

## 2020-08-09 MED ORDER — PREDNISONE 20 MG PO TABS
40.0000 mg | ORAL_TABLET | Freq: Every day | ORAL | Status: DC
  Filled 2020-08-09: qty 2

## 2020-08-09 MED ORDER — FUROSEMIDE 10 MG/ML IJ SOLN
40.0000 mg | Freq: Two times a day (BID) | INTRAMUSCULAR | Status: DC
  Administered 2020-08-09 (×2): 40 mg via INTRAVENOUS
  Filled 2020-08-09 (×3): qty 4

## 2020-08-09 MED ORDER — POTASSIUM CHLORIDE CRYS ER 20 MEQ PO TBCR
40.0000 meq | EXTENDED_RELEASE_TABLET | Freq: Two times a day (BID) | ORAL | Status: DC
  Administered 2020-08-09 – 2020-08-10 (×3): 40 meq via ORAL
  Filled 2020-08-09 (×3): qty 2

## 2020-08-09 MED ORDER — IPRATROPIUM-ALBUTEROL 0.5-2.5 (3) MG/3ML IN SOLN
3.0000 mL | Freq: Four times a day (QID) | RESPIRATORY_TRACT | Status: DC
  Administered 2020-08-09 – 2020-08-10 (×4): 3 mL via RESPIRATORY_TRACT
  Filled 2020-08-09 (×5): qty 3

## 2020-08-09 NOTE — ED Notes (Signed)
[place] tech at bedside for Echo at this time.

## 2020-08-09 NOTE — ED Notes (Signed)
Meal tray delivered to pt at this time.

## 2020-08-09 NOTE — ED Notes (Signed)
sats 96% on RA while awake

## 2020-08-09 NOTE — ED Notes (Signed)
Pt assisted to BR with standy assist. Pt tolerated well with use of walker.

## 2020-08-09 NOTE — ED Notes (Signed)
Pt OOB to BR with walker

## 2020-08-09 NOTE — Progress Notes (Signed)
*  PRELIMINARY RESULTS* Echocardiogram 2D Echocardiogram has been performed.  Nancy Combs 08/09/2020, 9:13 AM

## 2020-08-09 NOTE — ED Notes (Signed)
Transported upstairs with daughter and all belongings.

## 2020-08-09 NOTE — ED Notes (Signed)
Pt refusing to take prednisone, stating doctors last night told her not to. Pt unable to recall reasoning for not taking prednisone. MD notified.

## 2020-08-09 NOTE — ED Notes (Signed)
Patient ambulating with walker to restroom.

## 2020-08-09 NOTE — Evaluation (Signed)
Physical Therapy Evaluation Patient Details Name: Nancy Combs MRN: [id number] DOB: 1960-03-28 Today's Date: 08/09/2020   History of Present Illness  Pt is a 60 yo F diagnosed with progressive laxity status post left total knee arthroplasty and is s/p L TKA revision.  PMH includes: HTN, CAD, MI, COPD, lung CA s/p R lung and lobectomy.    Clinical Impression  Pt received in Semi-Fowler's position and agreeable to therapy.  Pt was able to demonstrate exercises that she has been performing in the bed and that she was taught at previous session.  Pt does not have good quad control and with verbal and visual cuing, was explained the importance of having terminal knee extension along with good quad activation in order to prevent knee buckling.  Pt verbally states she understood and would attempt to focus on quad strengthening activities as part of her HEP.  Pt then proceeded to perform bed mobility and ambulate towards the nursing station.  Pt was able to ambulate 100' total and come back to room.  Pt left in bed with all needs met, call bell within reach.    Polar ice cooler order placed for patient's knee to decrease swelling and to allow for pain relief for night's sleep.    Follow Up Recommendations Home health PT;Supervision for mobility/OOB    Equipment Recommendations  None recommended by PT    Recommendations for Other Services       Precautions / Restrictions Precautions Precautions: Fall Restrictions Weight Bearing Restrictions: Yes LLE Weight Bearing: Weight bearing as tolerated      Mobility  Bed Mobility Overal bed mobility: Needs Assistance Bed Mobility: Supine to Sit     Supine to sit: Min assist     General bed mobility comments: Min A to manage the LLE    Transfers Overall transfer level: Needs assistance Equipment used: Rolling walker (2 wheeled) Transfers: Sit to/from Stand Sit to Stand: Min guard         General transfer comment: Min verbal cues for  sequencing but steady with good eccentric and concentric control  Ambulation/Gait Ambulation/Gait assistance: Min guard Gait Distance (Feet): 100 Feet Assistive device: Rolling walker (2 wheeled) Gait Pattern/deviations: Step-to pattern;Antalgic;Decreased stance time - left Gait velocity: decreased   General Gait Details: No L knee buckling with ambulation, which was of primary concern for the pt.  Stairs            Wheelchair Mobility    Modified Rankin (Stroke Patients Only)       Balance Overall balance assessment: Needs assistance   Sitting balance-Leahy Scale: Normal     Standing balance support: Bilateral upper extremity supported;During functional activity Standing balance-Leahy Scale: Good                               Pertinent Vitals/Pain Pain Assessment: 0-10 Pain Score: 4  Pain Location: L knee Pain Descriptors / Indicators: Sharp Pain Intervention(s): Limited activity within patient's tolerance;Monitored during session;Repositioned    Home Living Family/patient expects to be discharged to:: Private residence Living Arrangements: Children Available Help at Discharge: Family;Available 24 hours/day Type of Home: House Home Access: Ramped entrance     Home Layout: One level Home Equipment: Walker - 4 wheels;Walker - 2 wheels;Cane - single point;Bedside commode Additional Comments: Pt lives with three of her children and has 24/7 support    Prior Function Level of Independence: Independent with assistive device(s)  Comments: Ind amb in the home without an AD, SPC in the community, 3 falls in the last 6 months primarily from L knee buckling     Hand Dominance   Dominant Hand: Right    Extremity/Trunk Assessment   Upper Extremity Assessment Upper Extremity Assessment: Overall WFL for tasks assessed    Lower Extremity Assessment Lower Extremity Assessment: Generalized weakness;LLE deficits/detail LLE Deficits /  Details: BLE ankle AROM and strength WNL; BLE sensation to light touch WNL LLE: Unable to fully assess due to pain LLE Sensation: WNL       Communication   Communication: No difficulties  Cognition Arousal/Alertness: Awake/alert Behavior During Therapy: WFL for tasks assessed/performed Overall Cognitive Status: Within Functional Limits for tasks assessed                                        General Comments      Exercises Total Joint Exercises Ankle Circles/Pumps: AROM;Strengthening;Both;10 reps;Supine Quad Sets: AROM;Strengthening;Both;10 reps;Supine Gluteal Sets: AROM;Strengthening;Both;10 reps;Supine Hip ABduction/ADduction: AROM;Strengthening;Both;10 reps;Supine Long Arc Quad: AROM;Strengthening;Both;10 reps;Seated Marching in Standing: AROM;Strengthening;Both;10 reps;Standing   Assessment/Plan    PT Assessment Patient needs continued PT services  PT Problem List Decreased strength;Decreased range of motion;Decreased activity tolerance;Decreased balance;Decreased mobility;Decreased knowledge of use of DME;Pain       PT Treatment Interventions DME instruction;Gait training;Functional mobility training;Therapeutic activities;Therapeutic exercise;Balance training;Patient/family education;Stair training    PT Goals (Current goals can be found in the Care Plan section)  Acute Rehab PT Goals Patient Stated Goal: To walk better PT Goal Formulation: With patient Time For Goal Achievement: 08/23/20 Potential to Achieve Goals: Good    Frequency BID   Barriers to discharge        Co-evaluation               AM-PAC PT "6 Clicks" Mobility  Outcome Measure Help needed turning from your back to your side while in a flat bed without using bedrails?: A Little Help needed moving from lying on your back to sitting on the side of a flat bed without using bedrails?: A Little Help needed moving to and from a bed to a chair (including a wheelchair)?: A  Little Help needed standing up from a chair using your arms (e.g., wheelchair or bedside chair)?: A Little Help needed to walk in hospital room?: A Little Help needed climbing 3-5 steps with a railing? : A Little 6 Click Score: 18    End of Session Equipment Utilized During Treatment: Gait belt Activity Tolerance: Patient tolerated treatment well Patient left: in bed;with family/visitor present (Pt left with daughter in the room.) Nurse Communication: Mobility status (Messaged Dr. Leslye Peer for [employer] for L Knee) PT Visit Diagnosis: Other abnormalities of gait and mobility (R26.89);Muscle weakness (generalized) (M62.81);Pain;History of falling (Z91.81) Pain - Right/Left: Left Pain - part of body: Knee    Time: 5102-5852 PT Time Calculation (min) (ACUTE ONLY): 69 min   Charges:     PT Treatments $Gait Training: 23-37 mins $Therapeutic Exercise: 8-22 mins $Self Care/Home Management: 8-22        Gwenlyn Saran, PT, DPT 08/09/20, 5:12 PM   Christie Nottingham 08/09/2020, 5:09 PM

## 2020-08-09 NOTE — ED Notes (Signed)
Report given to RN on next shift.

## 2020-08-09 NOTE — ED Notes (Signed)
MD at bedside assessing pt and updating pt on plan of care at this time.

## 2020-08-09 NOTE — Progress Notes (Addendum)
Patient ID: Nancy Combs, female   DOB: [birth year], 60 y.o.   MRN: [id number] [facility] PROGRESS NOTE  Nancy Combs [id number] DOB: 1960-01-27 DOA: 08/08/2020 PCP: Hortencia Pilar, MD  HPI/Subjective: Patient recently had a knee replacement.  Was home and noticed her pulse ox was in the 80s.  Her family was concerned and told her to come to the hospital.  Patient does not feel short of breath or have coughing.  History of COPD.  Objective: Vitals:   08/09/20 1200 08/09/20 1402  BP:  137/67  Pulse: 70 71  Resp: 13   Temp:    SpO2: 95% (!) 89%    Intake/Output Summary (Last 24 hours) at 08/09/2020 1416 Last data filed at 08/09/2020 0944 Gross per 24 hour  Intake --  Output 300 ml  Net -300 ml   Filed Weights   08/08/20 1153  Weight: 106.6 kg    ROS: Review of Systems  Respiratory:  Negative for cough and shortness of breath.   Cardiovascular:  Negative for chest pain.  Gastrointestinal:  Negative for abdominal pain, nausea and vomiting.  Exam: Physical Exam HENT:     Head: Normocephalic.     Mouth/Throat:     Pharynx: No oropharyngeal exudate.  Eyes:     General: Lids are normal.     Conjunctiva/sclera: Conjunctivae normal.  Cardiovascular:     Rate and Rhythm: Normal rate and regular rhythm.     Heart sounds: Normal heart sounds, S1 normal and S2 normal.  Pulmonary:     Breath sounds: Examination of the right-lower field reveals decreased breath sounds and rhonchi. Examination of the left-lower field reveals decreased breath sounds and rhonchi. Decreased breath sounds and rhonchi present. No wheezing or rales.  Abdominal:     Palpations: Abdomen is soft.     Tenderness: There is no abdominal tenderness.  Musculoskeletal:     Right lower leg: No swelling.     Left lower leg: No swelling.  Skin:    General: Skin is warm.     Findings: No rash.  Neurological:     Mental Status: She is alert and oriented to person, place, and time.     Data  Reviewed: Basic Metabolic Panel: Recent Labs  Lab 08/08/20 1201 08/09/20 0339  NA 138 139  K 3.6 3.3*  CL 101 101  CO2 27 30  GLUCOSE 193* 161*  BUN 21* 17  CREATININE 1.20* 0.98  CALCIUM 9.3 8.9    CBC: Recent Labs  Lab 08/08/20 1201  WBC 8.6  HGB 13.7  HCT 41.4  MCV 89.2  PLT 302    BNP (last 3 results) Recent Labs    08/08/20 1201  BNP 303.9*     CBG: Recent Labs  Lab 08/04/20 0814 08/04/20 1259 08/09/20 0730 08/09/20 1242  GLUCAP 198* 156* 162* 233*    Recent Results (from the past 240 hour(s))  SARS CORONAVIRUS 2 (TAT 6-24 HRS) Nasopharyngeal Nasopharyngeal Swab     Status: None   Collection Time: 08/02/20 11:44 AM   Specimen: Nasopharyngeal Swab  Result Value Ref Range Status   SARS Coronavirus 2 NEGATIVE NEGATIVE Final    Comment: (NOTE) SARS-CoV-2 target nucleic acids are NOT DETECTED.  The SARS-CoV-2 RNA is generally detectable in upper and lower respiratory specimens during the acute phase of infection. Negative results do not preclude SARS-CoV-2 infection, do not rule out co-infections with other pathogens, and should not be used as the sole basis for treatment or other patient management decisions.  Negative results must be combined with clinical observations, patient history, and epidemiological information. The expected result is Negative.  Fact Sheet for Patients: SugarRoll.be  Fact Sheet for Healthcare Providers: https://www.woods-mathews.com/  This test is not yet approved or cleared by the [place] FDA and  has been authorized for detection and/or diagnosis of SARS-CoV-2 by FDA under an Emergency Use Authorization (EUA). This EUA will remain  in effect (meaning this test can be used) for the duration of the COVID-19 declaration under Se ction 564(b)(1) of the Act, 21 U.S.C. section 360bbb-3(b)(1), unless the authorization is terminated or revoked sooner.  Performed at [facility], [place] [address] [place], [place] [address]   Aerobic/Anaerobic Culture w Gram Stain (surgical/deep wound)     Status: None   Collection Time: 08/04/20 11:13 AM   Specimen: PATH Other; Tissue  Result Value Ref Range Status   Specimen Description   Final    KNEE LEFT Performed at [facility], [address] [place], [place] [address]    Special Requests   Final    NONE Performed at [facility], [address] [place], [place] [address]    Gram Stain   Final    RARE WBC PRESENT, PREDOMINANTLY MONONUCLEAR NO ORGANISMS SEEN    Culture   Final    No growth aerobically or anaerobically. Performed at [facility], [place] [address], [place],  [address]    Report Status 08/09/2020 FINAL  Final  Resp Panel by RT-PCR (Flu A&B, Covid) Nasopharyngeal Swab     Status: None   Collection Time: 08/08/20  7:29 PM   Specimen: Nasopharyngeal Swab; Nasopharyngeal(NP) swabs in vial transport medium  Result Value Ref Range Status   SARS Coronavirus 2 by RT PCR NEGATIVE NEGATIVE Final    Comment: (NOTE) SARS-CoV-2 target nucleic acids are NOT DETECTED.  The SARS-CoV-2 RNA is generally detectable in upper respiratory specimens during the acute phase of infection. The lowest concentration of SARS-CoV-2 viral copies this assay can detect is 138 copies/mL. A negative result does not preclude SARS-Cov-2 infection and should not be used as the sole basis for treatment or other patient management decisions. A negative result may occur with  improper specimen collection/handling, submission of specimen other than nasopharyngeal swab, presence of viral mutation(s) within the areas targeted by this assay, and inadequate number of viral copies(<138 copies/mL). A negative result must be combined with clinical observations, patient history, and epidemiological information. The expected result is Negative.  Fact Sheet for Patients:   EntrepreneurPulse.com.au  Fact Sheet for Healthcare Providers:  IncredibleEmployment.be  This test is no t yet approved or cleared by the [place] FDA and  has been authorized for detection and/or diagnosis of SARS-CoV-2 by FDA under an Emergency Use Authorization (EUA). This EUA will remain  in effect (meaning this test can be used) for the duration of the COVID-19 declaration under Section 564(b)(1) of the Act, 21 U.S.C.section 360bbb-3(b)(1), unless the authorization is terminated  or revoked sooner.       Influenza A by PCR NEGATIVE NEGATIVE Final   Influenza B by PCR NEGATIVE NEGATIVE Final    Comment: (NOTE) The Xpert Xpress SARS-CoV-2/FLU/RSV plus assay is intended as an aid in the diagnosis of influenza from Nasopharyngeal swab specimens and should not be used as a sole basis for treatment. Nasal washings and aspirates are unacceptable for Xpert Xpress SARS-CoV-2/FLU/RSV testing.  Fact Sheet for Patients: EntrepreneurPulse.com.au  Fact Sheet for Healthcare Providers: IncredibleEmployment.be  This test is not yet approved or cleared by the [place] and has been authorized for detection and/or diagnosis of SARS-CoV-2 by FDA under an Emergency Use Authorization (EUA). This EUA will remain in effect (meaning this test can be used) for the duration of the COVID-19 declaration under Section 564(b)(1) of the Act, 21 U.S.C. section 360bbb-3(b)(1), unless the authorization is terminated or revoked.  Performed at [facility], [address] [place], [place] [address]      Studies: DG Chest 2 View  Result Date: 08/08/2020 CLINICAL DATA:  Shortness of breath. EXAM: CHEST - 2 VIEW COMPARISON:  None. FINDINGS: Enlarged cardiac silhouette. Calcific atherosclerosis of the aorta. Central pulmonary vascular congestion. Possible small bilateral pleural effusions. No visible  pneumothorax. Bibasilar opacities. Remote appearing right rib fracture. Clips project in the right hilar and right chest region. IMPRESSION: 1. Cardiomegaly with pulmonary vascular congestion and possible small bilateral pleural effusions. 2. Bibasilar opacities, which could represent atelectasis, aspiration, and/or pneumonia. Electronically Signed   By: Margaretha Sheffield MD   On: 08/08/2020 12:59   CT Angio Chest PE W and/or Wo Contrast  Result Date: 08/08/2020 CLINICAL DATA:  Hypoxia, surgery last Thursday (total knee revision), recently quit smoking EXAM: CT ANGIOGRAPHY CHEST WITH CONTRAST TECHNIQUE: Multidetector CT imaging of the chest was performed using the standard protocol during bolus administration of intravenous contrast. Multiplanar CT image reconstructions and MIPs were obtained to evaluate the vascular anatomy. CONTRAST:  19mL OMNIPAQUE IOHEXOL 350 MG/ML SOLN COMPARISON:  Radiograph 08/08/2020 FINDINGS: Cardiovascular: Satisfactory opacification of the pulmonary arteries to the segmental level. Evaluation somewhat limited due to extensive respiratory motion artifact which is most pronounced towards the lung bases. No convincing central, lobar proximal segmental filling defects are identified within the limitations of this exam. Cardiomegaly is noted. No pericardial effusion. Coronary artery atherosclerosis is seen as well as a probable stent right coronary. Atherosclerotic plaque within the normal caliber aorta. Insert aortic root No acute luminal abnormality of the imaged aorta. No periaortic stranding or hemorrhage. Normal 3 vessel branching of the aortic arch with calcific plaque throughout the proximal great vessels. No major venous abnormalities or significant venous reflux is seen. Mediastinum/Nodes: No mediastinal fluid or gas. Normal thyroid gland and thoracic inlet. No acute abnormality of the trachea or esophagus. Surgical clips noted along the right mediastinum compatible with prior  surgical truncation of the airways and vessels from prior lobectomy. No worrisome mediastinal, hilar or axillary adenopathy. Lungs/Pleura: Postsurgical changes from what appears to be a prior right upper lobectomy. Some compensatory hyperinflation changes of the right lung are seen. There are dependent atelectatic changes throughout the lungs with more patchy ground-glass opacity seen dependently with some fissural and and minimal septal thickening as well as small bilateral effusions which could reflect developing interstitial and alveolar edema. Infection or aspiration less favored. No pneumothorax. Upper Abdomen: No acute abnormalities present in the visualized portions of the upper abdomen. Diffuse hepatic hypoattenuation compatible with hepatic steatosis. Musculoskeletal: Several chronic right rib deformities including partial resection of the right posterior sixth rib compatible with postsurgical features from prior right lobectomy. No acute or worrisome osseous abnormality. No suspicious chest wall mass or lesion. Review of the MIP images confirms the above findings. IMPRESSION: 1. Satisfactory opacification of pulmonary arteries with exam quality limited by respiratory motion artifact. No large central, lobar proximal segmental filling defects are identified. 2. Dependent atelectasis is present with additional posterior ground-glass opacity in the lungs, left greater than right with fissural thickening  and mild septal thickening, suggestive of some degree of edema with infection or aspiration less favored. 3. Postsurgical changes from prior right lobectomy with surgical changes of the chest wall and along the right mediastinum. Some compensatory hyperinflation of the remaining right lung parenchyma. 4.  Aortic Atherosclerosis (ICD10-I70.0). 5. Coronary artery atherosclerosis and coronary stenting. Electronically Signed   By: Lovena Le M.D.   On: 08/08/2020 20:55   ECHOCARDIOGRAM COMPLETE  Result Date:  08/09/2020    ECHOCARDIOGRAM REPORT   Patient Name:   Nancy Combs Date of Exam: 08/09/2020 Medical Rec #:  [id number]     Height:       62.0 in Accession #:    [id number]    Weight:       235.0 lb Date of Birth:  May 12, 1960     BSA:          2.047 m Patient Age:    74 years      BP:           Not listed in chart/Not listed in                                             chart mmHg Patient Gender: F             HR:           71 bpm. Exam Location:  [facility] Procedure: 2D Echo, Cardiac Doppler and Color Doppler Indications:     Shortness of breath                  Elevated troponin  History:         Patient has no prior history of Echocardiogram examinations.                  Previous Myocardial Infarction, COPD; Risk Factors:Diabetes and                  Hypertension.  Sonographer:     Sherrie Sport RDCS (AE) Referring Phys:  [id number] Gretta Cool PATEL Diagnosing Phys: Yolonda Kida MD  Sonographer Comments: No apical window, no subcostal window and Technically challenging study due to limited acoustic windows. IMPRESSIONS  1. Left ventricular ejection fraction, by estimation, is 70 to 75%. The left ventricle has hyperdynamic function. The left ventricle has no regional wall motion abnormalities. There is mild left ventricular hypertrophy. Left ventricular diastolic function could not be evaluated.  2. Right ventricular systolic function is normal. The right ventricular size is normal.  3. The mitral valve is normal in structure. Trivial mitral valve regurgitation.  4. The aortic valve is normal in structure. Aortic valve regurgitation is not visualized. FINDINGS  Left Ventricle: Left ventricular ejection fraction, by estimation, is 70 to 75%. The left ventricle has hyperdynamic function. The left ventricle has no regional wall motion abnormalities. The left ventricular internal cavity size was normal in size. There is mild left ventricular hypertrophy. Left ventricular diastolic function could not be evaluated. Right  Ventricle: The right ventricular size is normal. No increase in right ventricular wall thickness. Right ventricular systolic function is normal. Left Atrium: Left atrial size was normal in size. Right Atrium: Right atrial size was normal in size. Pericardium: There is no evidence of pericardial effusion. Mitral Valve: The mitral valve is normal in structure. Trivial mitral valve regurgitation. Tricuspid Valve: The tricuspid valve is  normal in structure. Tricuspid valve regurgitation is mild. Aortic Valve: The aortic valve is normal in structure. Aortic valve regurgitation is not visualized. Pulmonic Valve: The pulmonic valve was grossly normal. Pulmonic valve regurgitation is not visualized. Aorta: The ascending aorta was not well visualized. IAS/Shunts: No atrial level shunt detected by color flow Doppler.  LEFT VENTRICLE PLAX 2D LVIDd:         2.96 cm LVIDs:         1.67 cm LV PW:         1.23 cm LV IVS:        1.51 cm LVOT diam:     2.20 cm LVOT Area:     3.80 cm  LEFT ATRIUM         Index LA diam:    4.20 cm 2.05 cm/m                        PULMONIC VALVE AORTA                 RVOT Peak grad: 6 mmHg Ao Root diam: 2.90 cm  TRICUSPID VALVE TR Peak grad:   7.8 mmHg TR Vmax:        140.00 cm/s  SHUNTS Systemic Diam: 2.20 cm Dwayne D Callwood MD Electronically signed by Yolonda Kida MD Signature Date/Time: 08/09/2020/1:38:38 PM    Final     Scheduled Meds:  apixaban  2.5 mg Oral BID   aspirin EC  81 mg Oral Daily   [START ON 08/10/2020] azithromycin  250 mg Oral Daily   budesonide (PULMICORT) nebulizer solution  0.25 mg Nebulization BID   furosemide  40 mg Intravenous BID   insulin aspart  0-15 Units Subcutaneous TID WC   insulin aspart  0-5 Units Subcutaneous QHS   ipratropium-albuterol  3 mL Nebulization Q6H   metoprolol succinate  100 mg Oral QHS   potassium chloride  40 mEq Oral BID   sodium chloride flush  3 mL Intravenous Q12H   Continuous Infusions:  sodium chloride     Brief history.   Patient admitted 08/08/2020 with hypoxia.  Had recent knee replacement.  CT scan negative for pulmonary embolism but did show groundglass opacities which could be fluid or infection.  I started on IV Lasix and nebulizer treatments.  Procalcitonin negative so antibiotics only given for COPD exacerbation.  Patient declined steroids.  Was hypoxic when she fell asleep so we will get an overnight oximetry.  Able to come off oxygen while awake.  Assessment/Plan:  Acute hypoxic respiratory failure with hypoxia.  The patient does not wear oxygen at home.  Initial pulse ox 80%.  Patient able to come off oxygen while awake.  With sleeping does drop down into the 80s again.  Likely has underlying sleep apnea.  We will check an overnight oximetry. Fluid overload and acute diastolic congestive heart failure.  Had recent knee replacement surgery.  We will give Lasix 40 mg IV twice daily for now.  On metoprolol at night COPD exacerbation.  Started nebulizer treatments.  Declined steroids.  Empiric Zithromax for COPD exacerbation.  CT scan showing groundglass opacities.  Since procalcitonin negative we will hold on full treatment for pneumonia. Obesity with a BMI of 42.98.  With hypoxia with sleep apnea likely has underlying sleep apnea.  Will need an outpatient sleep study. Previous history of lung cancer Type 2 diabetes mellitus on sliding scale insulin.  Glipizide and Glucophage as outpatient.  We will  hold on these for right now.  Blood sugar 161. Acute kidney injury has improved.  Creatinine 1.2 on admission down to 0.98.  Continue to monitor with diuresis.       Code Status:     Code Status Orders  (From admission, onward)           Start     Ordered   08/08/20 2222  Full code  Continuous        08/08/20 2221           Code Status History     Date Active Date Inactive Code Status Order ID Comments User Context   08/04/2020 1228 08/04/2020 2249 Full Code [id number]  Corky Mull, MD Inpatient    11/19/2019 1213 11/19/2019 2242 Full Code [id number]  Poggi, Marshall Cork, MD Inpatient      Family Communication: Spoke with daughter at the bedside Disposition Plan: Status is: Inpatient  Dispo: The patient is from: Home              Anticipated d/c is to: Potentially home tomorrow if doing better.              Patient currently with hypoxia with sleeping.  We will get an overnight oximetry.  I am hopeful that she will be able to come off oxygen during the day.   Difficult to place patient.  No.  Consultants: Dr. Roland Rack orthopedics will see tomorrow if still here in the hospital  Antibiotics: Zithromax for COPD exacerbation  Time spent: 45 minutes  [place]

## 2020-08-10 DIAGNOSIS — N179 Acute kidney failure, unspecified: Secondary | ICD-10-CM

## 2020-08-10 DIAGNOSIS — I5031 Acute diastolic (congestive) heart failure: Secondary | ICD-10-CM | POA: Diagnosis not present

## 2020-08-10 DIAGNOSIS — J9601 Acute respiratory failure with hypoxia: Secondary | ICD-10-CM | POA: Diagnosis not present

## 2020-08-10 LAB — BASIC METABOLIC PANEL
Anion gap: 12 (ref 5–15)
BUN: 18 mg/dL (ref 6–20)
CO2: 30 mmol/L (ref 22–32)
Calcium: 9.1 mg/dL (ref 8.9–10.3)
Chloride: 99 mmol/L (ref 98–111)
Creatinine, Ser: 0.95 mg/dL (ref 0.44–1.00)
GFR, Estimated: >60 mL/min (ref >60)
Glucose, Bld: 167 mg/dL — ABNORMAL HIGH (ref 70–99)
Potassium: 3.8 mmol/L (ref 3.5–5.1)
Sodium: 141 mmol/L (ref 135–145)

## 2020-08-10 LAB — PROCALCITONIN: Procalcitonin: <0.1 ng/mL

## 2020-08-10 LAB — GLUCOSE, CAPILLARY
Glucose-Capillary: 176 mg/dL — ABNORMAL HIGH (ref 70–99)
Glucose-Capillary: 188 mg/dL — ABNORMAL HIGH (ref 70–99)

## 2020-08-10 LAB — MAGNESIUM: Magnesium: 1.6 mg/dL — ABNORMAL LOW (ref 1.7–2.4)

## 2020-08-10 MED ORDER — MAGNESIUM SULFATE 2 GM/50ML IV SOLN
2.0000 g | Freq: Once | INTRAVENOUS | Status: DC
  Filled 2020-08-10: qty 50

## 2020-08-10 MED ORDER — LACTULOSE 10 GM/15ML PO SOLN
20.0000 g | Freq: Once | ORAL | Status: AC
  Administered 2020-08-10: 20 g via ORAL
  Filled 2020-08-10: qty 30

## 2020-08-10 NOTE — TOC Initial Note (Signed)
Transition of Care [facility]) - Initial/Assessment Note    Patient Details  Name: Nancy Combs MRN: [id number] Date of Birth: 24-Dec-1960  Transition of Care [facility]) CM/SW Contact:    Shelbie Hutching, RN Phone Number: 08/10/2020, 10:39 AM  Clinical Narrative:                 Patient admitted to the hospital with COPD exacerbation, patient recently had a total knee revision with Dr. Roland Rack.  RNCM met with patient at the bedside this morning.  Patient will discharge home today.  Patient is from home in [place] with her 3 adult children.  Patient does not wear oxygen at home but has qualified with an overnight oximetry for night oxygen.  Oxygen ordered from [employer] and will be delivered to the patient's home, she does not need to wait here for any tanks.  Patient has a wheelchair, walker, and 3 in 1 at home.  PT is recommending home health and patient would really like for someone to come out to the home to work with her before she starts going to outpatient therapy.  Patient believes that she has used [facility] in the past know [facility].  [name] with [facility] given referral and she will check to see if they can accept.   Patient is current with her PCP in [facility], Dr. Hoy Morn.    Expected Discharge Plan: [place] Barriers to Discharge: Continued Medical Work up   Patient Goals and CMS Choice Patient states their goals for this hospitalization and ongoing recovery are:: Patient would like PT at home CMS Medicare.gov Compare Post Acute Care list provided to:: Patient Choice offered to / list presented to : Patient  Expected Discharge Plan and Services Expected Discharge Plan: [place]   Discharge Planning Services: CM Consult Post Acute Care Choice: Home Health, Durable Medical Equipment Living arrangements for the past 2 months: Single Family Home Expected Discharge Date: 08/10/20               DME Arranged: Oxygen DME Agency: [facility] Date  DME Agency Contacted: 08/10/20 Time DME Agency Contacted: 0102 Representative spoke with at DME Agency: Andree Coss HH Arranged: RN, PT, OT [facility] Agency: [place] Date [place]: 08/10/20 Time [place]: 80 Representative spoke with at [place]: [name]  Prior Living Arrangements/Services Living arrangements for the past 2 months: [place] Lives with:: Adult Children Patient language and need for interpreter reviewed:: Yes Do you feel safe going back to the place where you live?: Yes      Need for Family Participation in Patient Care: Yes (Comment) (COPD and total knee revision) Care giver support system in place?: Yes (comment) Current home services: DME (Wheelchair, walker, 3 in 1) Criminal Activity/Legal Involvement Pertinent to Current Situation/Hospitalization: No - Comment as needed  Activities of Daily Living Home Assistive Devices/Equipment: Wheelchair, Environmental consultant (specify type) ADL Screening (condition at time of admission) Patient's cognitive ability adequate to safely complete daily activities?: Yes Is the patient deaf or have difficulty hearing?: No Does the patient have difficulty seeing, even when wearing glasses/contacts?: No Does the patient have difficulty concentrating, remembering, or making decisions?: No Patient able to express need for assistance with ADLs?: Yes Does the patient have difficulty dressing or bathing?: No Independently performs ADLs?: Yes (appropriate for developmental age) Does the patient have difficulty walking or climbing stairs?: Yes Weakness of Legs: Left Weakness of Arms/Hands: None  Permission Sought/Granted  Permission sought to share information with : Case Manager, Other (comment) Permission granted to share information with : Yes, Verbal Permission Granted     Permission granted to share info w AGENCY: [facility]        Emotional Assessment Appearance:: Appears stated  age Attitude/Demeanor/Rapport: Engaged Affect (typically observed): Accepting Orientation: : Oriented to Self, Oriented to Place, Oriented to  Time, Oriented to Situation Alcohol / Substance Use: Not Applicable Psych Involvement: No (comment)  Admission diagnosis:  Acute respiratory failure with hypoxia (HCC) [J96.01] Patient Active Problem List   Diagnosis Date Noted   Acute diastolic CHF (congestive heart failure) (HCC)    Morbid obesity ([place])    Acute respiratory failure with hypoxia ([place]) 08/08/2020   AKI (acute kidney injury) ([place]) 08/08/2020   Atherosclerotic heart disease of native coronary artery without angina pectoris 04/03/2017   GERD (gastroesophageal reflux disease) 02/24/2017   Diabetes mellitus without complication ([place]) 97/35/3299   COPD with acute exacerbation ([place]) 01/23/2017   PCP:  Hortencia Pilar, MD Pharmacy:   [facility] [place], [place] AT [address] [place] [address] Phone: [phone] Fax: [phone]     Social Determinants of Health (SDOH) Interventions    Readmission Risk Interventions No flowsheet data found.

## 2020-08-10 NOTE — Discharge Summary (Addendum)
Physician Discharge Summary  Patient ID: Nancy Combs MRN: [id number] DOB/AGE: 05/30/1960 60 y.o.  Admit date: 08/08/2020 Discharge date: 08/10/2020  Admission Diagnoses:  Discharge Diagnoses:  Principal Problem:   Acute respiratory failure with hypoxia ([place]) Active Problems:   Atherosclerotic heart disease of native coronary artery without angina pectoris   COPD with acute exacerbation (H[facility])   GERD (gastroesophageal reflux disease)   Diabetes mellitus without complication (HCC)   AKI (acute kidney injury) ([place])   Acute diastolic CHF (congestive heart failure) ([place])   Morbid obesity ([place])   Discharged Condition: good  Hospital Course:  Patient admitted 08/08/2020 with hypoxia.  Had recent knee replacement.  CT scan negative for pulmonary embolism but did show groundglass opacities which could be fluid or infection.  I started on IV Lasix and nebulizer treatments.  Procalcitonin negative so antibiotics only given for COPD exacerbation.  Patient declined steroids.  Was hypoxic when she fell asleep so we will get an overnight oximetry.  Able to come off oxygen while awake. After reviewing patient medical record, patient does not appear to have any volume overload.  Echocardiogram showed ejection fraction 70 to 75%, no evidence of chronic congestive heart failure. Patient did have weight gain lately, she also has COPD.  Overnight oximetry reviewed hypoxemia at nighttime.  Patient most likely has obstructive sleep apnea.  At this point, we will obtain home oxygen evaluation, minimally patient he will need nocturnal oxygen.  Need to follow-up with PCP for sleep study.  1.  Acute hypoxemic respiratory failure. Acute on chronic diastolic congestive heart failure. Presumed obstructive sleep apnea. COPD.  COPD exacerbation ruled out. Based on echocardiogram and clinical impression, patient no longer has any volume overload.  She probably had some volume overload at the time of admission, that  has resolved.  I will discontinue IV Lasix. She does not appear to have COPD exacerbation. Overnight oximetry showed hypoxemia at nighttime with oxygen saturation dropped down to 84%, she probably has sleep apnea.  Will prescribe nocturnal oxygen, she will need outpatient sleep study.  2.  Obesity.  3.  Type 2 diabetes. Resume all home medicines  4.  Acute kidney injury  Renal function improved.  #5.  Hypokalemia. Hypomagnesemia. Potassium has normalized.  We will give 2 g of magnesium sulfate today.     Consults: None  Significant Diagnostic Studies:  CT ANGIOGRAPHY CHEST WITH CONTRAST   TECHNIQUE: Multidetector CT imaging of the chest was performed using the standard protocol during bolus administration of intravenous contrast. Multiplanar CT image reconstructions and MIPs were obtained to evaluate the vascular anatomy.   CONTRAST:  78mL OMNIPAQUE IOHEXOL 350 MG/ML SOLN   COMPARISON:  Radiograph 08/08/2020   FINDINGS: Cardiovascular: Satisfactory opacification of the pulmonary arteries to the segmental level. Evaluation somewhat limited due to extensive respiratory motion artifact which is most pronounced towards the lung bases. No convincing central, lobar proximal segmental filling defects are identified within the limitations of this exam. Cardiomegaly is noted. No pericardial effusion. Coronary artery atherosclerosis is seen as well as a probable stent right coronary. Atherosclerotic plaque within the normal caliber aorta. Insert aortic root No acute luminal abnormality of the imaged aorta. No periaortic stranding or hemorrhage. Normal 3 vessel branching of the aortic arch with calcific plaque throughout the proximal great vessels. No major venous abnormalities or significant venous reflux is seen.   Mediastinum/Nodes: No mediastinal fluid or gas. Normal thyroid gland and thoracic inlet. No acute abnormality of the trachea or esophagus. 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 No worrisome mediastinal, hilar or axillary adenopathy.   Lungs/Pleura: Postsurgical changes from what appears to be a prior right upper lobectomy. Some compensatory hyperinflation changes of the right lung are seen. There are dependent atelectatic changes throughout the lungs with more patchy ground-glass opacity seen dependently with some fissural and and minimal septal thickening as well as small bilateral effusions which could reflect developing interstitial and alveolar edema. Infection or aspiration less favored. No pneumothorax.   Upper Abdomen: No acute abnormalities present in the visualized portions of the upper abdomen. Diffuse hepatic hypoattenuation compatible with hepatic steatosis.   Musculoskeletal: Several chronic right rib deformities including partial resection of the right posterior sixth rib compatible with postsurgical features from prior right lobectomy. No acute or worrisome osseous abnormality. No suspicious chest wall mass or lesion.   Review of the MIP images confirms the above findings.   IMPRESSION: 1. Satisfactory opacification of pulmonary arteries with exam quality limited by respiratory motion artifact. No large central, lobar proximal segmental filling defects are identified. 2. Dependent atelectasis is present with additional posterior ground-glass opacity in the lungs, left greater than right with fissural thickening and mild septal thickening, suggestive of some degree of edema with infection or aspiration less favored. 3. Postsurgical changes from prior right lobectomy with surgical changes of the chest wall and along the right mediastinum. Some compensatory hyperinflation of the remaining right lung parenchyma. 4.  Aortic Atherosclerosis (ICD10-I70.0). 5. Coronary artery atherosclerosis and coronary stenting.      Electronically Signed   By: Lovena Le M.D.   On: 08/08/2020 20:55  Echo: 1. Left ventricular ejection fraction, by estimation, is 70 to 75%. The left ventricle has hyperdynamic function. The left ventricle has no regional wall motion abnormalities. There is mild left ventricular hypertrophy. Left ventricular diastolic function could not be evaluated. 2. Right ventricular systolic function is normal. The right ventricular size is normal. 3. The mitral valve is normal in structure. Trivial mitral valve regurgitation. 4. The aortic valve is normal in structure. Aortic valve regurgitation is not visualized.  Treatments: Oxygen,  Discharge Exam: Blood pressure 135/85, pulse 78, temperature 98.4 F (36.9 C), resp. rate 20, height 5\' 2"  (1.575 m), weight 106.6 kg, SpO2 93 %. General appearance: alert and cooperative Resp: Decreased breathing sounds without crackles or wheezes. Cardio: regular rate and rhythm, S1, S2 normal, no murmur, click, rub or gallop GI: soft, non-tender; bowel sounds normal; no masses,  no organomegaly Extremities: extremities normal, atraumatic, no cyanosis or edema  Disposition: Discharge disposition: 01-Home or Self Care       Discharge Instructions     Diet - low sodium heart healthy   Complete by: As directed    Discharge wound care:   Complete by: As directed    Follow with orthopedics as scheduled   Increase activity slowly   Complete by: As directed       Allergies as of 08/10/2020       Reactions   Nicotine Hives, Rash   Rash occurred after using the patch for two weeks, then hives spread from application site to face and neck.        Medication List     TAKE these medications    acetaminophen 325 MG tablet Commonly known as: TYLENOL Take 650 mg by mouth every 6 (six) hours as needed for moderate pain.   amLODipine 10 MG tablet Commonly known as:  NORVASC Take 1 tablet (10 mg total) by mouth daily.   apixaban 2.5 MG Tabs  tablet Commonly known as: Eliquis Take 1 tablet (2.5 mg total) by mouth 2 (two) times daily.   clopidogrel 75 MG tablet Commonly known as: PLAVIX Take 75 mg by mouth daily.   folic acid 1 MG tablet Commonly known as: FOLVITE Take 1 mg by mouth daily.   glipiZIDE 5 MG tablet Commonly known as: GLUCOTROL Take 5 mg by mouth 2 (two) times daily at 8 am and 10 pm.   hydrochlorothiazide 25 MG tablet Commonly known as: HYDRODIURIL Take 25 mg by mouth daily.   losartan 100 MG tablet Commonly known as: COZAAR Take 1 tablet (100 mg total) by mouth daily.   metFORMIN 500 MG 24 hr tablet Commonly known as: GLUCOPHAGE-XR Take 500-1,000 mg by mouth See admin instructions. Take 500 mg by mouth in the morning and 1000 mg in the evening   metoprolol succinate 100 MG 24 hr tablet Commonly known as: TOPROL-XL Take 1 tablet (100 mg total) by mouth daily. Take with or immediately following a meal. What changed: when to take this   oxyCODONE 5 MG immediate release tablet Commonly known as: Roxicodone Take 1-2 tablets (5-10 mg total) by mouth every 4 (four) hours as needed for moderate pain or severe pain.   simvastatin 20 MG tablet Commonly known as: ZOCOR Take 1 tablet (20 mg total) by mouth daily.   Tiotropium Bromide-Olodaterol 2.5-2.5 MCG/ACT Aers Commonly known as: Stiolto Respimat Inhale 2 puffs into the lungs daily.   tiZANidine 2 MG tablet Commonly known as: ZANAFLEX Take 2 mg by mouth 3 (three) times daily as needed.   varenicline 1 MG tablet Commonly known as: CHANTIX Take 1 mg by mouth 2 (two) times daily.               Discharge Care Instructions  (From admission, onward)           Start     Ordered   08/10/20 0000  Discharge wound care:       Comments: Follow with orthopedics as scheduled   08/10/20 1016            Follow-up Information     Hortencia Pilar, MD Follow up in 1 week(s).   Specialty: Family Medicine Contact information: [place] [address] [phone]                32 minutes Signed: Sharen Hones 08/10/2020, 10:17 AM

## 2020-08-10 NOTE — TOC Progression Note (Signed)
Transition of Care [facility]) - Progression Note    Patient Details  Name: Lavelle Berland MRN: [id number] Date of Birth: [birth year]  Transition of Care [facility] CM/SW Contact  Shelbie Hutching, RN Phone Number: 08/10/2020, 11:03 AM  Clinical Narrative:    [facility] unable to staff patient for home health.  RNCM has reached out to [place] [facility] - she will check to see if they can accept.   Expected Discharge Plan: [place] Barriers to Discharge: Continued Medical Work up  Expected Discharge Plan and Services Expected Discharge Plan: [place]   Discharge Planning Services: CM Consult Post Acute Care Choice: Home Health, Durable Medical Equipment Living arrangements for the past 2 months: Single Family Home Expected Discharge Date: 08/10/20               DME Arranged: Oxygen DME Agency: [employer] Date DME Agency Contacted: 08/10/20 Time DME Agency Contacted: [id number] Representative spoke with at DME Agency: Andree Coss HH Arranged: RN, PT, OT [facility] Agency: [place] Date [place]: 08/10/20 Time [place]: 1038 Representative spoke with at [place]: [name]   Social Determinants of Health ([place]) Interventions    Readmission Risk Interventions No flowsheet data found.

## 2021-07-31 ENCOUNTER — Other Ambulatory Visit: Payer: Self-pay

## 2021-07-31 ENCOUNTER — Ambulatory Visit
Admission: EM | Admit: 2021-07-31 | Discharge: 2021-07-31 | Disposition: A | Discharge status: Home / Self Care | Payer: BC Managed Care – PPO | Attending: Physician Assistant | Admitting: Physician Assistant

## 2021-07-31 DIAGNOSIS — E876 Hypokalemia: Secondary | ICD-10-CM | POA: Diagnosis present

## 2021-07-31 DIAGNOSIS — K529 Noninfective gastroenteritis and colitis, unspecified: Secondary | ICD-10-CM | POA: Diagnosis present

## 2021-07-31 DIAGNOSIS — R109 Unspecified abdominal pain: Secondary | ICD-10-CM | POA: Diagnosis present

## 2021-07-31 DIAGNOSIS — R519 Headache, unspecified: Secondary | ICD-10-CM | POA: Diagnosis present

## 2021-07-31 DIAGNOSIS — R110 Nausea: Secondary | ICD-10-CM

## 2021-07-31 LAB — CBC WITH DIFFERENTIAL/PLATELET
Abs Immature Granulocytes: 0.14 10*3/uL — ABNORMAL HIGH (ref 0.00–0.07)
Basophils Absolute: 0.1 10*3/uL (ref 0.0–0.1)
Basophils Relative: 1 %
Eosinophils Absolute: 0.3 10*3/uL (ref 0.0–0.5)
Eosinophils Relative: 3 %
HCT: 43.9 % (ref 36.0–46.0)
Hemoglobin: 14.9 g/dL (ref 12.0–15.0)
Immature Granulocytes: 1 %
Lymphocytes Relative: 25 %
Lymphs Abs: 2.7 10*3/uL (ref 0.7–4.0)
MCH: 28.9 pg (ref 26.0–34.0)
MCHC: 33.9 g/dL (ref 30.0–36.0)
MCV: 85.1 fL (ref 80.0–100.0)
Monocytes Absolute: 0.7 10*3/uL (ref 0.1–1.0)
Monocytes Relative: 6 %
Neutro Abs: 7.1 10*3/uL (ref 1.7–7.7)
Neutrophils Relative %: 64 %
Platelets: 323 10*3/uL (ref 150–400)
RBC: 5.16 MIL/uL — ABNORMAL HIGH (ref 3.87–5.11)
RDW: 13.1 % (ref 11.5–15.5)
WBC: 10.9 10*3/uL — ABNORMAL HIGH (ref 4.0–10.5)
nRBC: 0 % (ref 0.0–0.2)

## 2021-07-31 LAB — COMPREHENSIVE METABOLIC PANEL
ALT: 23 U/L (ref 0–44)
AST: 14 U/L — ABNORMAL LOW (ref 15–41)
Albumin: 3.6 g/dL (ref 3.5–5.0)
Alkaline Phosphatase: 105 U/L (ref 38–126)
Anion gap: 11 (ref 5–15)
BUN: 28 mg/dL — ABNORMAL HIGH (ref 6–20)
CO2: 23 mmol/L (ref 22–32)
Calcium: 9.3 mg/dL (ref 8.9–10.3)
Chloride: 101 mmol/L (ref 98–111)
Creatinine, Ser: 1.3 mg/dL — ABNORMAL HIGH (ref 0.44–1.00)
GFR, Estimated: 47 mL/min — ABNORMAL LOW (ref >60)
Glucose, Bld: 139 mg/dL — ABNORMAL HIGH (ref 70–99)
Potassium: 3.1 mmol/L — ABNORMAL LOW (ref 3.5–5.1)
Sodium: 135 mmol/L (ref 135–145)
Total Bilirubin: 0.3 mg/dL (ref 0.3–1.2)
Total Protein: 7.2 g/dL (ref 6.5–8.1)

## 2021-07-31 MED ORDER — ONDANSETRON 8 MG PO TBDP
8.0000 mg | ORAL_TABLET | Freq: Once | ORAL | Status: AC
Start: 2021-07-31 — End: 2021-07-31
  Administered 2021-07-31: 8 mg via ORAL

## 2021-07-31 MED ORDER — ACETAMINOPHEN 325 MG PO TABS
650.0000 mg | ORAL_TABLET | Freq: Once | ORAL | Status: AC
  Administered 2021-07-31: 650 mg via ORAL

## 2021-07-31 MED ORDER — SODIUM CHLORIDE 0.9 % IV BOLUS
1000.0000 mL | Freq: Once | INTRAVENOUS | Status: AC
Start: 2021-07-31 — End: 2021-07-31
  Administered 2021-07-31: 1000 mL via INTRAVENOUS

## 2021-07-31 MED ORDER — ONDANSETRON 4 MG PO TBDP: 4.0000 mg | ORAL_TABLET | Freq: Four times a day (QID) | ORAL | 0 refills | Status: AC | PRN

## 2021-07-31 MED ORDER — POTASSIUM CHLORIDE CRYS ER 20 MEQ PO TBCR: 20.0000 meq | EXTENDED_RELEASE_TABLET | Freq: Two times a day (BID) | ORAL | 0 refills | Status: DC

## 2021-07-31 NOTE — ED Triage Notes (Addendum)
Pt states she started one week ago with a stomach bug that had been going around his family. Started last Monday with diarrhea which lasted x 3 days. Then had n/v for 2 days. Intermittent fevers all week. Intermittent headache with some nausea and stomach cramps. No diarrhea or vomiting since early to mid-week last week but states " I just can't shake this."

## 2021-07-31 NOTE — Discharge Instructions (Addendum)
-  Your labs show that you are dehydrated.  Your potassium was also low and this is most likely from the diarrhea.  I have sent potassium replacement for the next couple of days.  Make sure to drink plenty of fluids--really try to increase your water intake or go for Pedialyte. -I sent the nausea medicine to the pharmacy as well.  You can take Tylenol for pain relief if you need it.  -You should be feeling better over the next 3 days but if you feel worse you should follow-up with your primary care provider or go to the emergency department for any severe acute worsening of her symptoms. - Try to have your electrolyte levels rechecked in a few days to make sure your potassium has normalized.  Discussed this with your primary care provider.  They may be able to order this outpatient for you.  ABDOMINAL PAIN: You may take Tylenol for pain relief. Use medications as directed including antiemetics and antidiarrheal medications if suggested or prescribed. You should increase fluids and electrolytes as well as rest over these next several days. If you have any questions or concerns, or if your symptoms are not improving or if especially if they acutely worsen, please call or stop back to the clinic immediately and we will be happy to help you or go to the ER   ABDOMINAL PAIN RED FLAGS: Seek immediate further care if: symptoms remain the same or worsen over the next 3-7 days, you are unable to keep fluids down, you see blood or mucus in your stool, you vomit black or dark red material, you have a fever of 101.F or higher, you have localized and/or persistent abdominal pain

## 2021-07-31 NOTE — ED Notes (Signed)
Pt taking sips of water

## 2021-07-31 NOTE — ED Provider Notes (Signed)
[facility]    CSN: [id number] Arrival date & time: 07/31/21  1625      History   Chief Complaint Chief Complaint  Patient presents with   Fever   Nausea    HPI Nancy Combs is a 61 y.o. female presenting for concerns about nausea and abdominal cramping for the past 1.5 weeks.  Patient reports her entire household got sick with diarrhea and vomiting related to a stomach bug and she is feeling 1 who has not fully recovered.  She reports the last time she had diarrhea was 5 days ago.  Reports she took Imodium and the diarrhea went away.  She has not had any Imodium in the past 5 days and did not have a BM for 5 days until today when she passed a hard formed stool.  She has not had any vomiting in the past 5 days.  She reports low-grade temps up to 100.3 which have continued.  The highest her temp got was up to 101 when the symptoms began.  She reports reduced appetite.  Has been eating a bland diet.  She does admit that she does not drink water.  Reports she drinks milk and soda mostly.  Reports intermittent headaches as well.  Patient says that she just cannot begin to feel better.  No other complaints.  Patient's medical history significant for history of lung cancer, coronary artery disease, chronic kidney disease, COPD, diabetes, hypertension, hyperlipidemia, obesity, previous MI and polycythemia vera.  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   CAD (coronary artery disease)    Chronic anticoagulation    DAPT (ASA + clopidogrel)   Chronic kidney disease    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    COPD (chronic obstructive pulmonary disease) (HCC)    Diabetes mellitus without complication (HCC)    Dyspnea    HLD (hyperlipidemia)    Hyperlipidemia    Hypertension    MI (myocardial infarction) ([place]) 02/2017   PCI with DES x 1 to RCA   PV (polycythemia vera) ([place])     Patient Active Problem List   Diagnosis Date Noted   Acute  diastolic CHF (congestive heart failure) ([place])    Morbid obesity ([place])    Acute respiratory failure with hypoxia ([place]) 08/08/2020   AKI (acute kidney injury) ([place]) 08/08/2020   Atherosclerotic heart disease of native coronary artery without angina pectoris 04/03/2017   GERD (gastroesophageal reflux disease) 02/24/2017   Diabetes mellitus without complication ([place]) 53/29/9242   COPD with acute exacerbation ([place]) 01/23/2017    Past Surgical History:  Procedure Laterality Date   CESAREAN SECTION     CORONARY ANGIOPLASTY WITH STENT PLACEMENT  02/17/2017   4.0 x 18 mm Xience DES to RCA   LUNG LOBECTOMY Right 2016   PERICARDIAL WINDOW  2005   THYROIDECTOMY, PARTIAL  1982   TOTAL KNEE ARTHROPLASTY Left 11/19/2019   Procedure: TOTAL KNEE ARTHROPLASTY;  Surgeon: Corky Mull, MD;  Location: [facility] ORS;  Service: Orthopedics;  Laterality: Left;   TOTAL KNEE REVISION Left 08/04/2020   Procedure: POLYETHYLENE EXCHANGE OF LEFT KNEE;  Surgeon: Corky Mull, MD;  Location: [facility] ORS;  Service: Orthopedics;  Laterality: Left;    OB History   No obstetric history on file.      Home Medications    Prior to Admission medications   Medication Sig Start Date End Date Taking? Authorizing Provider  ondansetron (ZOFRAN-ODT) 4 MG disintegrating tablet Take 1  tablet (4 mg total) by mouth every 6 (six) hours as needed for up to 3 days for nausea or vomiting. 07/31/21 08/03/21 Yes Laurene Footman B, PA-C  potassium chloride SA (KLOR-CON M) 20 MEQ tablet Take 1 tablet (20 mEq total) by mouth 2 (two) times daily for 3 days. 07/31/21 08/03/21 Yes Danton Clap, PA-C  acetaminophen (TYLENOL) 325 MG tablet Take 650 mg by mouth every 6 (six) hours as needed for moderate pain.    [provider]  amLODipine (NORVASC) 10 MG tablet Take 1 tablet (10 mg total) by mouth daily. 11/13/17   Katy Apo, NP  apixaban (ELIQUIS) 2.5 MG TABS tablet Take 1 tablet (2.5 mg total) by mouth 2 (two) times daily. 08/05/20    Poggi, Marshall Cork, MD  clopidogrel (PLAVIX) 75 MG tablet Take 75 mg by mouth daily.    [provider]  folic acid (FOLVITE) 1 MG tablet Take 1 mg by mouth daily.    [provider]  glipiZIDE (GLUCOTROL) 5 MG tablet Take 5 mg by mouth 2 (two) times daily at 8 am and 10 pm. 10/21/19   [provider]  hydrochlorothiazide (HYDRODIURIL) 25 MG tablet Take 25 mg by mouth daily.    [provider]  losartan (COZAAR) 100 MG tablet Take 1 tablet (100 mg total) by mouth daily. 11/13/17   Katy Apo, NP  metFORMIN (GLUCOPHAGE-XR) 500 MG 24 hr tablet Take 500-1,000 mg by mouth See admin instructions. Take 500 mg by mouth in the morning and 1000 mg in the evening 09/29/19   [provider]  metoprolol succinate (TOPROL-XL) 100 MG 24 hr tablet Take 1 tablet (100 mg total) by mouth daily. Take with or immediately following a meal. 11/13/17   Amyot, Nicholes Stairs, NP  oxyCODONE (ROXICODONE) 5 MG immediate release tablet Take 1-2 tablets (5-10 mg total) by mouth every 4 (four) hours as needed for moderate pain or severe pain. 08/04/20   Poggi, Marshall Cork, MD  OZEMPIC, 0.25 OR 0.5 MG/DOSE, 2 MG/3ML SOPN Inject 0.5 mg into the skin once a week. 06/22/21   [provider]  simvastatin (ZOCOR) 20 MG tablet Take 1 tablet (20 mg total) by mouth daily. 11/13/17   Katy Apo, NP  Tiotropium Bromide-Olodaterol (STIOLTO RESPIMAT) 2.5-2.5 MCG/ACT AERS Inhale 2 puffs into the lungs daily. 11/13/17   Katy Apo, NP  tiZANidine (ZANAFLEX) 2 MG tablet Take 2 mg by mouth 3 (three) times daily as needed. 08/04/20   [provider]  varenicline (CHANTIX) 1 MG tablet Take 1 mg by mouth 2 (two) times daily.    [provider]    Family History Family History  Problem Relation Age of Onset   Diabetes Mother    Hypertension Mother     Social History Social History   Tobacco Use   Smoking status: Some Days    Packs/day: 0.50    Types: Cigarettes    Last  attempt to quit: 08/26/2019    Years since quitting: 1.9   Smokeless tobacco: Never  Vaping Use   Vaping Use: Never used  Substance Use Topics   Alcohol use: Never   Drug use: Never     Allergies   Nicotine   Review of Systems Review of Systems  Constitutional:  Positive for appetite change and fatigue. Negative for fever.  Respiratory:  Negative for shortness of breath.   Cardiovascular:  Negative for chest pain.  Gastrointestinal:  Positive for abdominal pain and nausea.  Negative for constipation, diarrhea and vomiting.  Genitourinary:  Negative for decreased urine volume.  Musculoskeletal:  Negative for arthralgias and myalgias.  Neurological:  Positive for headaches. Negative for dizziness and weakness.     Physical Exam Triage Vital Signs ED Triage Vitals  Enc Vitals Group     BP      Pulse      Resp      Temp      Temp src      SpO2      Weight      Height      Head Circumference      Peak Flow      Pain Score      Pain Loc      Pain Edu?      Excl. in [place]?    No data found.  Updated Vital Signs BP 131/78 (BP Location: Right Arm)   Pulse 89   Temp 97.7 F (36.5 C) (Oral)   Resp 19   Ht 5\' 2"  (1.575 m)   Wt 229 lb (103.9 kg)   SpO2 97%   BMI 41.88 kg/m   Physical Exam Vitals and nursing note reviewed.  Constitutional:      General: She is not in acute distress.    Appearance: Normal appearance. She is not ill-appearing or toxic-appearing.  HENT:     Head: Normocephalic and atraumatic.     Nose: Nose normal.     Mouth/Throat:     Mouth: Mucous membranes are moist.     Pharynx: Oropharynx is clear.  Eyes:     General: No scleral icterus.       Right eye: No discharge.        Left eye: No discharge.     Conjunctiva/sclera: Conjunctivae normal.  Cardiovascular:     Rate and Rhythm: Normal rate and regular rhythm.     Heart sounds: Normal heart sounds.  Pulmonary:     Effort: Pulmonary effort is normal. No respiratory distress.     Breath  sounds: Normal breath sounds.  Abdominal:     General: Bowel sounds are normal.     Palpations: Abdomen is soft.     Tenderness: There is abdominal tenderness (generalized). There is no guarding or rebound.  Musculoskeletal:     Cervical back: Neck supple.  Skin:    General: Skin is dry.     Comments: Mildly reduced skin turgor  Neurological:     General: No focal deficit present.     Mental Status: She is alert. Mental status is at baseline.     Motor: No weakness.     Gait: Gait normal.  Psychiatric:        Mood and Affect: Mood normal.        Behavior: Behavior normal.        Thought Content: Thought content normal.      UC Treatments / Results  Labs (all labs ordered are listed, but only abnormal results are displayed) Labs Reviewed  COMPREHENSIVE METABOLIC PANEL - Abnormal; Notable for the following components:      Result Value   Potassium 3.1 (*)    Glucose, Bld 139 (*)    BUN 28 (*)    Creatinine, Ser 1.30 (*)    AST 14 (*)    GFR, Estimated 47 (*)    All other components within normal limits  CBC WITH DIFFERENTIAL/PLATELET - Abnormal; Notable for the following components:   WBC 10.9 (*)  RBC 5.16 (*)    Abs Immature Granulocytes 0.14 (*)    All other components within normal limits  CBC WITH DIFFERENTIAL/PLATELET    EKG   Radiology No results found.  Procedures Procedures (including critical care time)  Medications Ordered in UC Medications  sodium chloride 0.9 % bolus 1,000 mL (0 mLs Intravenous Stopped 07/31/21 1948)  ondansetron (ZOFRAN-ODT) disintegrating tablet 8 mg (8 mg Oral Given 07/31/21 1748)  acetaminophen (TYLENOL) tablet 650 mg (650 mg Oral Given 07/31/21 1926)    Initial Impression / Assessment and Plan / UC Course  I have reviewed the triage vital signs and the nursing notes.  Pertinent labs & imaging results that were available during my care of the patient were reviewed by me and considered in my medical decision making (see  chart for details).  61 year old female presenting for 1.5-week history of nausea and abdominal cramping.  Reports approximately 4-day history of diarrhea and vomiting which resolved 5 days ago.  Low-grade temps up to 100.3 degrees have persisted.  She has taken Tylenol.  Temp currently 97.7 degrees.  She is overall well-appearing.  She does have slightly reduced skin turgor.  Chest clear to auscultation heart regular rate and rhythm.  Abdomen is soft with normal bowel sounds and generalized tenderness.  Discussed with patient obtaining a x-ray to assess for small bowel obstruction and constipation given that she only had a small and very hard BM today after 5 days.  She declines to have this done today.  We also discussed obtaining an IV to give her some IV fluids which I think would be helpful.  Ordered CBC and CMP.  Patient was given 8 mg ODT Zofran which she said helped.  CBC shows slightly elevated WBCs at 10.9.  CMP shows potassium decreased at 3.1, elevated glucose at 139, elevated BUN at 28, elevated creatinine 1.3 and decreased GFR 47.    Of note, patient does have stage III CKD.  Suspect some mild to moderate dehydration based on her labs.  Hypokalemia likely related to the diarrhea.  *** Final Clinical Impressions(s) / UC Diagnoses   Final diagnoses:  Abdominal cramping  Nausea  Acute gastroenteritis  Hypokalemia  Acute nonintractable headache, unspecified headache type     Discharge Instructions      -Your labs show that you are dehydrated.  Your potassium was also low and this is most likely from the diarrhea.  I have sent potassium replacement for the next couple of days.  Make sure to drink plenty of fluids--really try to increase your water intake or go for Pedialyte. -I sent the nausea medicine to the pharmacy as well.  You can take Tylenol for pain relief if you need it.  -You should be feeling better over the next 3 days but if you feel worse you should follow-up  with your primary care provider or go to the emergency department for any severe acute worsening of her symptoms. - Try to have your electrolyte levels rechecked in a few days to make sure your potassium has normalized.  Discussed this with your primary care provider.  They may be able to order this outpatient for you.  ABDOMINAL PAIN: You may take Tylenol for pain relief. Use medications as directed including antiemetics and antidiarrheal medications if suggested or prescribed. You should increase fluids and electrolytes as well as rest over these next several days. If you have any questions or concerns, or if your symptoms are not improving or if especially if they  acutely worsen, please call or stop back to the clinic immediately and we will be happy to help you or go to the ER   ABDOMINAL PAIN RED FLAGS: Seek immediate further care if: symptoms remain the same or worsen over the next 3-7 days, you are unable to keep fluids down, you see blood or mucus in your stool, you vomit black or dark red material, you have a fever of 101.F or higher, you have localized and/or persistent abdominal pain       ED Prescriptions     Medication Sig Dispense Auth. Provider   potassium chloride SA (KLOR-CON M) 20 MEQ tablet Take 1 tablet (20 mEq total) by mouth 2 (two) times daily for 3 days. 6 tablet Laurene Footman B, PA-C   ondansetron (ZOFRAN-ODT) 4 MG disintegrating tablet Take 1 tablet (4 mg total) by mouth every 6 (six) hours as needed for up to 3 days for nausea or vomiting. 12 tablet Gretta Cool      PDMP not reviewed this encounter.

## 2022-01-09 IMAGING — CR DG CHEST 2V
2 series · 2 of 2 positions shown · non-contrast
Comparison: None.

CLINICAL DATA: Shortness of breath.

EXAM:
CHEST - 2 VIEW

[chest pa]
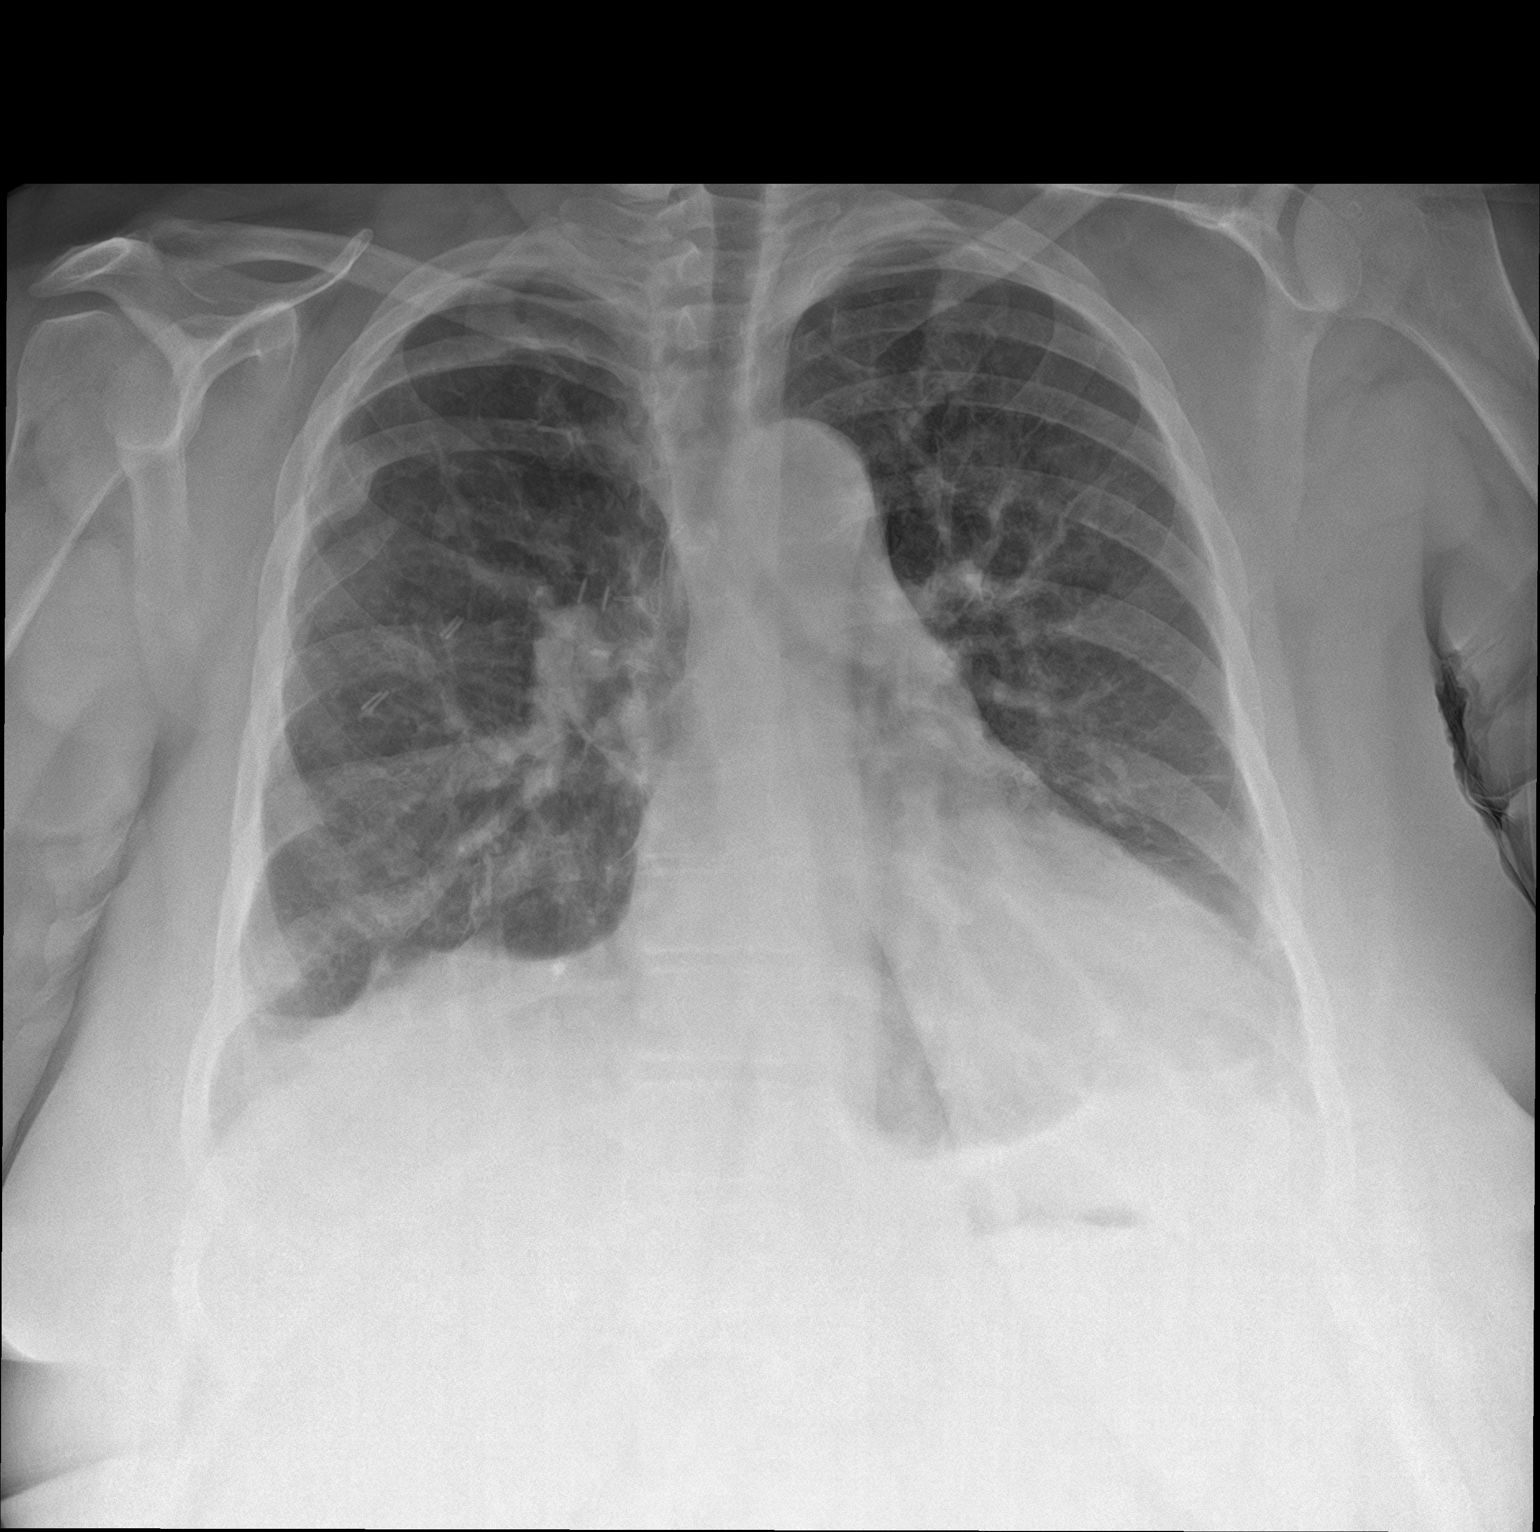

[chest lat]
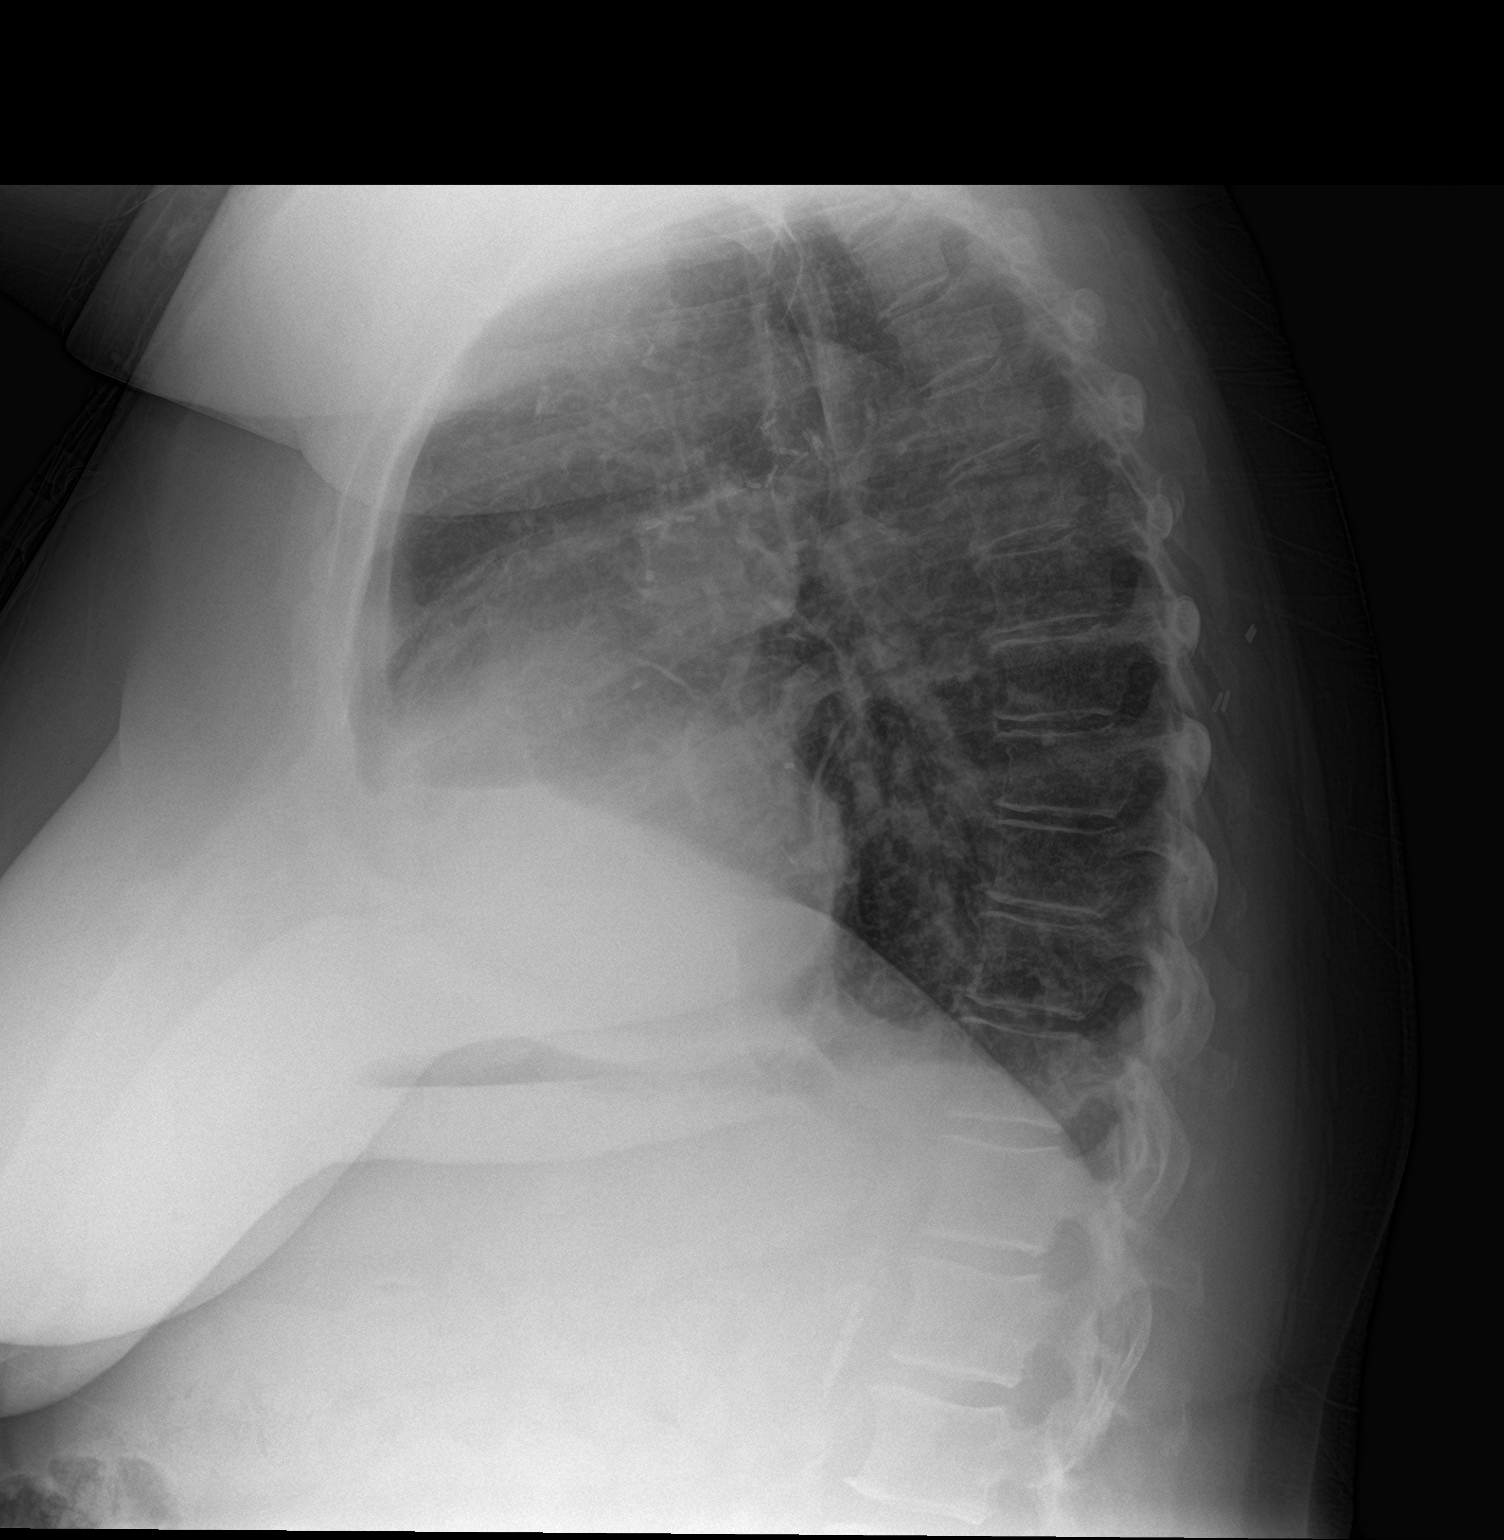

[2 of 2 positions shown; findings below may reference images not displayed]

FINDINGS: Enlarged cardiac silhouette. Calcific atherosclerosis of the aorta.
Central pulmonary vascular congestion. Possible small bilateral
pleural effusions. No visible pneumothorax. Bibasilar opacities.
Remote appearing right rib fracture. Clips project in the right
hilar and right chest region.
IMPRESSION: 1. Cardiomegaly with pulmonary vascular congestion and possible
small bilateral pleural effusions.
2. Bibasilar opacities, which could represent atelectasis,
aspiration, and/or pneumonia.

## 2022-08-10 ENCOUNTER — Encounter: Payer: Self-pay | Admitting: *Deleted

## 2022-08-10 ENCOUNTER — Ambulatory Visit: Payer: BC Managed Care – PPO

## 2022-08-10 ENCOUNTER — Ambulatory Visit
Admission: EM | Admit: 2022-08-10 | Discharge: 2022-08-10 | Disposition: A | Discharge status: Home / Self Care | Payer: BC Managed Care – PPO | Attending: Family Medicine | Admitting: Family Medicine

## 2022-08-10 ENCOUNTER — Other Ambulatory Visit: Payer: Self-pay

## 2022-08-10 DIAGNOSIS — C3480 Malignant neoplasm of overlapping sites of unspecified bronchus and lung: Secondary | ICD-10-CM | POA: Diagnosis not present

## 2022-08-10 DIAGNOSIS — R051 Acute cough: Secondary | ICD-10-CM | POA: Diagnosis not present

## 2022-08-10 DIAGNOSIS — R059 Cough, unspecified: Secondary | ICD-10-CM | POA: Diagnosis not present

## 2022-08-10 MED ORDER — LEVOFLOXACIN 750 MG PO TABS: 750.0000 mg | ORAL_TABLET | Freq: Every day | ORAL | 0 refills | Status: DC

## 2022-08-10 NOTE — ED Triage Notes (Signed)
PT reports the cough ,SHOB,and congestion started last Fridy. Pt reports cough is worse as well as cough. Pt had a lobectomy April 2024 fro lung CA. Pt has a fever the first part as high as 101.

## 2022-08-10 NOTE — ED Provider Notes (Signed)
[facility]    CSN: [id number] Arrival date & time: 08/10/22  1125      History   Chief Complaint Chief Complaint  Patient presents with   Cough   Nasal Congestion   Shortness of Breath    HPI Nancy Combs is a 62 y.o. female.   HPI  History obtained from the patient and her son . Nancy Combs presents for productive cough for 1 week. Has some shortness of breath. She has COPD,  lung cancer and takes immunotherapy.  She had a right upper lobectomy in March and left lower lobectomy in  April. Follows with oncology.  States has been using her albuterol inhaler frequently        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    COPD (chronic obstructive pulmonary disease) (HCC)    Diabetes mellitus without complication (HCC)    Dyspnea    HLD (hyperlipidemia)    Hyperlipidemia    Hypertension    MI (myocardial infarction) ([facility]) 02/2017   PCI with DES x 1 to RCA   PV (polycythemia vera) (HCC)     Patient Active Problem List   Diagnosis Date Noted   Acute diastolic CHF (congestive heart failure) (HCC)    Morbid obesity (HCC)    Acute respiratory failure with hypoxia (HCC) 08/08/2020   AKI (acute kidney injury) ([facility]) 08/08/2020   Atherosclerotic heart disease of native coronary artery without angina pectoris 04/03/2017   GERD (gastroesophageal reflux disease) 02/24/2017   Diabetes mellitus without complication (HCC) 02/24/2017   COPD with acute exacerbation (HCC) 01/23/2017    Past Surgical History:  Procedure Laterality Date   CESAREAN SECTION     CORONARY ANGIOPLASTY WITH STENT PLACEMENT  02/17/2017   4.0 x 18 mm Xience DES to RCA   LUNG LOBECTOMY Right 2016   PERICARDIAL WINDOW  2005   THYROIDECTOMY, PARTIAL  1982   TOTAL KNEE ARTHROPLASTY Left 11/19/2019   Procedure: TOTAL  KNEE ARTHROPLASTY;  Surgeon: Christena Flake, MD;  Location: [facility] ORS;  Service: Orthopedics;  Laterality: Left;   TOTAL KNEE REVISION Left 08/04/2020   Procedure: POLYETHYLENE EXCHANGE OF LEFT KNEE;  Surgeon: Christena Flake, MD;  Location: [facility] ORS;  Service: Orthopedics;  Laterality: Left;    OB History   No obstetric history on file.      Home Medications    Prior to Admission medications   Medication Sig Start Date End Date Taking? Authorizing Provider  acetaminophen (TYLENOL) 325 MG tablet Take 650 mg by mouth every 6 (six) hours as needed for moderate pain.   Yes [provider]  amLODipine (NORVASC) 10 MG tablet Take 1 tablet (10 mg total) by mouth daily. 11/13/17  Yes Amyot, Ali Lowe, NP  clopidogrel (PLAVIX) 75 MG tablet Take 75 mg by mouth daily.   Yes [provider]  folic acid (FOLVITE) 1 MG tablet Take 1 mg by mouth daily.   Yes [provider]  glipiZIDE (GLUCOTROL) 5 MG tablet Take 5 mg by mouth 2 (two) times daily at 8 am and 10 pm. 10/21/19  Yes [provider]  hydrochlorothiazide (HYDRODIURIL) 25 MG tablet Take 25 mg by mouth daily.   Yes [provider]  levofloxacin (LEVAQUIN) 750 MG tablet Take 1 tablet (750 mg total) by  mouth daily. 08/10/22  Yes , , DO  losartan (COZAAR) 100 MG tablet Take 1 tablet (100 mg total) by mouth daily. 11/13/17  Yes Amyot, Ali Lowe, NP  metFORMIN (GLUCOPHAGE-XR) 500 MG 24 hr tablet Take 500-1,000 mg by mouth See admin instructions. Take 500 mg by mouth in the morning and 1000 mg in the evening 09/29/19  Yes [provider]  metoprolol succinate (TOPROL-XL) 100 MG 24 hr tablet Take 1 tablet (100 mg total) by mouth daily. Take with or immediately following a meal. 11/13/17  Yes Amyot, Ali Lowe, NP  potassium chloride SA (KLOR-CON M) 20 MEQ tablet Take 1 tablet (20 mEq total) by mouth 2 (two) times daily for 3 days. 07/31/21 08/10/22 Yes Shirlee Latch, PA-C  simvastatin (ZOCOR) 20  MG tablet Take 1 tablet (20 mg total) by mouth daily. 11/13/17  Yes Amyot, Ali Lowe, NP  Tiotropium Bromide-Olodaterol (STIOLTO RESPIMAT) 2.5-2.5 MCG/ACT AERS Inhale 2 puffs into the lungs daily. 11/13/17  Yes Amyot, Ali Lowe, NP  varenicline (CHANTIX) 1 MG tablet Take 1 mg by mouth 2 (two) times daily.   Yes [provider]  apixaban (ELIQUIS) 2.5 MG TABS tablet Take 1 tablet (2.5 mg total) by mouth 2 (two) times daily. 08/05/20   Poggi, Excell Seltzer, MD  oxyCODONE (ROXICODONE) 5 MG immediate release tablet Take 1-2 tablets (5-10 mg total) by mouth every 4 (four) hours as needed for moderate pain or severe pain. 08/04/20   Poggi, Excell Seltzer, MD  OZEMPIC, 0.25 OR 0.5 MG/DOSE, 2 MG/3ML SOPN Inject 0.5 mg into the skin once a week. 06/22/21   [provider]  tiZANidine (ZANAFLEX) 2 MG tablet Take 2 mg by mouth 3 (three) times daily as needed. 08/04/20   [provider]    Family History Family History  Problem Relation Age of Onset   Diabetes Mother    Hypertension Mother     Social History Social History   Tobacco Use   Smoking status: Some Days    Current packs/day: 0.00    Types: Cigarettes    Last attempt to quit: 08/26/2019    Years since quitting: 2.9   Smokeless tobacco: Never  Vaping Use   Vaping status: Never Used  Substance Use Topics   Alcohol use: Never   Drug use: Never     Allergies   Nicotine   Review of Systems Review of Systems: negative unless otherwise stated in HPI.      Physical Exam Triage Vital Signs ED Triage Vitals  Encounter Vitals Group     BP 08/10/22 1159 126/79     Systolic BP Percentile --      Diastolic BP Percentile --      Pulse Rate 08/10/22 1159 78     Resp 08/10/22 1159 20     Temp 08/10/22 1159 98.5 F (36.9 C)     Temp src --      SpO2 08/10/22 1159 97 %     Weight --      Height --      Head Circumference --      Peak Flow --      Pain Score 08/10/22 1152 6     Pain Loc --      Pain Education --       Exclude from Growth Chart --    No data found.  Updated Vital Signs BP 126/79   Pulse 78   Temp 98.5 F (36.9 C)   Resp 20  SpO2 97%   Visual Acuity Right Eye Distance:   Left Eye Distance:   Bilateral Distance:    Right Eye Near:   Left Eye Near:    Bilateral Near:     Physical Exam GEN:     alert, non-toxic appearing female in no distress    HENT:  mucus membranes moist, no nasal discharge Normal EYES:   no scleral injection or discharge RESP:  no increased work of breathing, cough, coarse breathe sounds throughout, distant air sounds in right upper and left lower lung.  CVS:   regular rate and rhythm Skin:   warm and dry, no rash on visible skin    UC Treatments / Results  Labs (all labs ordered are listed, but only abnormal results are displayed) Labs Reviewed - No data to display  EKG   Radiology DG Chest 2 View  Result Date: 08/10/2022 CLINICAL DATA:  History of lobectomy.  Cough and short of breath EXAM: CHEST - 2 VIEW COMPARISON:  CT chest 08/08/2020 FINDINGS: Normal cardiac silhouette there is volume loss in the LEFT hemithorax. RIGHT lung is hyperexpanded. Small chronic LEFT effusion noted. Subtle density in the LEFT upper lobe. Difficult to distinguish an external density versus pulmonary nodule. band of potential scarring in the mid LEFT lung. IMPRESSION: 1. Postsurgical change LEFT hemithorax. 2. Potential nodular densities in LEFT lung. Recommend non emergent CT of the thorax to compared to more remote CT from 2022. These results will be called to the ordering clinician or representative by the Radiologist Assistant, and communication documented in the PACS or [employer]. Electronically Signed   By: Genevive Bi M.D.   On: 08/10/2022 13:19     Procedures Procedures (including critical care time)  Medications Ordered in UC Medications - No data to display  Initial Impression / Assessment and Plan / UC Course  I have reviewed the triage  vital signs and the nursing notes.  Pertinent labs & imaging results that were available during my care of the patient were reviewed by me and considered in my medical decision making (see chart for details).      Pt is a 62 y.o. female with COPD who presents for 1 weeks of cough that is not improving.  Sian is  afebrile here. Satting 97% on room air. Reviewed CT Chest from 06/2022 tht indicated right upper and left lower lobectomy. Overall pt is  non-toxic appearing, well hydrated, without respiratory distress. Pulmonary exam is remarkable for coarse breathe sounds and cough.  COVID  and influenza testing deferred due to length of symptoms.   After shared decision making, we pursued chest x-ray. CXR showed new nodular densities. Recommended pt follow up with her pulmonologist / oncologist.  Treat acute COPD exacerbation with  antibiotics as below. Steroids declined.  Typical duration of symptoms discussed. Return and ED precautions given and patient voiced understanding.   Discussed MDM, treatment plan and plan for follow-up with patient who agrees with plan.      Final Clinical Impressions(s) / UC Diagnoses   Final diagnoses:  Acute cough  Malignant neoplasm of overlapping sites of lung, unspecified laterality [facility])     Discharge Instructions      You need to have a follow up CT scan of your chest as you have some new nodular changes in your left lung. Call you oncologist or lung doctor today to discuss next steps.         ED Prescriptions     Medication Sig Dispense  Auth. Provider   levofloxacin (LEVAQUIN) 750 MG tablet Take 1 tablet (750 mg total) by mouth daily. 5 tablet Katha Cabal, DO      PDMP not reviewed this encounter.   Katha Cabal, DO 08/18/22 1537

## 2022-08-10 NOTE — Discharge Instructions (Addendum)
You need to have a follow up CT scan of your chest as you have some new nodular changes in your left lung. Call you oncologist or lung doctor today to discuss next steps.

## 2022-11-13 ENCOUNTER — Inpatient Hospital Stay: Payer: Managed Care, Other (non HMO)

## 2022-11-13 ENCOUNTER — Inpatient Hospital Stay: Payer: Managed Care, Other (non HMO) | Admitting: Oncology

## 2022-11-16 ENCOUNTER — Inpatient Hospital Stay: Payer: Managed Care, Other (non HMO) | Attending: Oncology | Admitting: Oncology

## 2022-11-16 ENCOUNTER — Other Ambulatory Visit: Payer: Managed Care, Other (non HMO)

## 2022-11-16 ENCOUNTER — Encounter: Payer: Self-pay | Admitting: Oncology

## 2022-11-16 VITALS — BP 124/66 | HR 86 | Temp 97.4°F | Resp 16 | Ht 62.0 in | Wt 202.0 lb

## 2022-11-16 DIAGNOSIS — F17210 Nicotine dependence, cigarettes, uncomplicated: Secondary | ICD-10-CM | POA: Diagnosis not present

## 2022-11-16 DIAGNOSIS — C3492 Malignant neoplasm of unspecified part of left bronchus or lung: Secondary | ICD-10-CM | POA: Diagnosis present

## 2022-11-16 DIAGNOSIS — C3401 Malignant neoplasm of right main bronchus: Secondary | ICD-10-CM | POA: Insufficient documentation

## 2022-11-16 NOTE — Progress Notes (Signed)
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  INTERVAL HISTORY: Patient is a 62 year old female who was originally diagnosed with a stage IIa adenocarcinoma of the right lung in 2016.  She underwent lobectomy at the time, but did not require any further treatment.  In November 2023 she was noted to have a enlarging hypermetabolic left lung nodule that was confirmed to be a second primary given the time in between diagnosis.  She underwent neoadjuvant treatment with carboplatinum, pemetrexed, and nivolumab for 3 cycles and then subsequently had a left lobectomy on April 20, 2022.  She then continued with maintenance nivolumab after her surgery.  This was then discontinued in September 2024 secondary to severe joint pain.  In August 2024 CT chest revealed an enlarging paratracheal mediastinal lymph node concerning for progressive disease and patient subsequently started concurrent chemotherapy with carboplatinum, Taxol, and daily XRT.  Taxol was discontinued secondary to severe reaction and patient was placed on Abraxane.  She returns to clinic today for consideration of a second opinion.  Patient currently feels well.  She is tolerating her treatments without significant side effects.  She has no neurologic complaints.  She denies any recent fevers or illnesses.  She has a good appetite and denies weight loss.  She has no chest pain or shortness of breath.  She does admit to occasional cough with hemoptysis.  She has no abdominal pain.  She denies any nausea, vomiting, constipation, or diarrhea.  She has no urinary complaints.  Patient offers no further specific complaints today.  REVIEW OF SYSTEMS:   Review of  Systems  Constitutional: Negative.  Negative for fever, malaise/fatigue and weight loss.  Respiratory:  Positive for cough and hemoptysis. Negative for shortness of breath.   Cardiovascular: Negative.  Negative for chest pain and leg swelling.  Gastrointestinal: Negative.  Negative for abdominal pain.  Genitourinary: Negative.  Negative for dysuria.  Musculoskeletal: Negative.  Negative for back pain.  Skin: Negative.  Negative for rash.  Neurological: Negative.  Negative for dizziness, focal weakness, weakness and headaches.  Psychiatric/Behavioral: Negative.  The patient is not nervous/anxious.     As per HPI. Otherwise, a complete review of systems is negative.  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    COPD (chronic obstructive pulmonary disease) (HCC)    Diabetes mellitus without complication (HCC)    Dyspnea    HLD (hyperlipidemia)    Hyperlipidemia    Hypertension    MI (myocardial infarction) (HCC) 02/2017   PCI with DES x 1 to RCA   PV (polycythemia vera) (HCC)     PAST SURGICAL HISTORY: Past Surgical History:  Procedure Laterality Date   CESAREAN SECTION     CORONARY ANGIOPLASTY WITH STENT PLACEMENT  02/17/2017   4.0 x 18 mm Xience DES to RCA   LUNG LOBECTOMY Right 2016   PERICARDIAL WINDOW  2005   THYROIDECTOMY, PARTIAL  1982   TOTAL KNEE ARTHROPLASTY Left 11/19/2019   Procedure: TOTAL KNEE ARTHROPLASTY;  Surgeon: Christena Flake, MD;  Location:  [facility] ORS;  Service: Orthopedics;  Laterality: Left;   TOTAL KNEE REVISION Left 08/04/2020   Procedure: POLYETHYLENE EXCHANGE OF LEFT KNEE;  Surgeon: Christena Flake, MD;  Location: [facility] ORS;  Service: Orthopedics;  Laterality: Left;    FAMILY HISTORY: Family History  Problem Relation Age of Onset   Diabetes Mother     Hypertension Mother     ADVANCED DIRECTIVES (Y/N):  N  HEALTH MAINTENANCE: Social History   Tobacco Use   Smoking status: Some Days    Current packs/day: 0.00    Types: Cigarettes    Last attempt to quit: 08/26/2019    Years since quitting: 3.2   Smokeless tobacco: Never  Vaping Use   Vaping status: Never Used  Substance Use Topics   Alcohol use: Never   Drug use: Never     Colonoscopy:  PAP:  Bone density:  Lipid panel:  Allergies  Allergen Reactions   Paclitaxel Anaphylaxis   Nicotine Hives and Rash    Rash occurred after using the patch for two weeks, then hives spread from application site to face and neck.     Current Outpatient Medications  Medication Sig Dispense Refill   amLODipine (NORVASC) 10 MG tablet Take 1 tablet (10 mg total) by mouth daily. 30 tablet 1   clopidogrel (PLAVIX) 75 MG tablet Take 75 mg by mouth daily.     EPINEPHrine 0.3 mg/0.3 mL IJ SOAJ injection Inject 0.3 mg into the muscle as needed.     folic acid (FOLVITE) 1 MG tablet Take 1 mg by mouth daily.     glipiZIDE (GLUCOTROL) 5 MG tablet Take 10 mg by mouth 2 (two) times daily at 8 am and 10 pm.     hydrochlorothiazide (HYDRODIURIL) 25 MG tablet Take 25 mg by mouth daily.     JANUVIA 100 MG tablet Take 100 mg by mouth daily.     levothyroxine (SYNTHROID) 50 MCG tablet Take 50 mcg by mouth daily.     loratadine (CLARITIN) 10 MG tablet Take 10 mg by mouth daily as needed.     LORazepam (ATIVAN) 0.5 MG tablet Take 0.5 mg by mouth every 8 (eight) hours as needed.     losartan (COZAAR) 100 MG tablet Take 1 tablet (100 mg total) by mouth daily. 30 tablet 1   metFORMIN (GLUCOPHAGE-XR) 500 MG 24 hr tablet Take 500 mg by mouth See admin instructions.     metoprolol succinate (TOPROL-XL) 100 MG 24 hr tablet Take 1 tablet (100 mg total) by mouth daily. Take with or immediately following a meal. 30 tablet 1   pantoprazole (PROTONIX) 40 MG tablet Take 40 mg by mouth daily.     predniSONE (DELTASONE)  2.5 MG tablet Take 7.5 mg by mouth daily with breakfast.     simvastatin (ZOCOR) 20 MG tablet Take 1 tablet (20 mg total) by mouth daily. 30 tablet 1   Tiotropium Bromide-Olodaterol (STIOLTO RESPIMAT) 2.5-2.5 MCG/ACT AERS Inhale 2 puffs into the lungs daily. 1 Inhaler 1   traMADol (ULTRAM) 50 MG tablet Take 50 mg by mouth every 6 (six) hours as needed.     acetaminophen (TYLENOL) 325 MG tablet Take 650 mg by mouth every 6 (six) hours as needed for moderate pain. (Patient not taking: Reported on 11/16/2022)     apixaban (ELIQUIS) 2.5 MG TABS tablet Take 1 tablet (2.5 mg total) by mouth 2 (two) times daily. (Patient not taking: Reported on 11/16/2022) 30 tablet 0   varenicline (CHANTIX) 1 MG tablet Take  1 mg by mouth 2 (two) times daily. (Patient not taking: Reported on 11/16/2022)     No current facility-administered medications for this visit.    OBJECTIVE: Vitals:   11/16/22 0905  BP: 124/66  Pulse: 86  Resp: 16  Temp: (!) 97.4 F (36.3 C)  SpO2: 98%     Body mass index is 36.95 kg/m.    ECOG FS:0 - Asymptomatic  General: Well-developed, well-nourished, no acute distress. Eyes: Pink conjunctiva, anicteric sclera. HEENT: Normocephalic, moist mucous membranes. Lungs: No audible wheezing or coughing. Heart: Regular rate and rhythm. Abdomen: Soft, nontender, no obvious distention. Musculoskeletal: No edema, cyanosis, or clubbing. Neuro: Alert, answering all questions appropriately. Cranial nerves grossly intact. Skin: No rashes or petechiae noted. Psych: Normal affect. Lymphatics: No cervical, calvicular, axillary or inguinal LAD.   LAB RESULTS:  Lab Results  Component Value Date   NA 135 07/31/2021   K 3.1 (L) 07/31/2021   CL 101 07/31/2021   CO2 23 07/31/2021   GLUCOSE 139 (H) 07/31/2021   BUN 28 (H) 07/31/2021   CREATININE 1.30 (H) 07/31/2021   CALCIUM 9.3 07/31/2021   PROT 7.2 07/31/2021   ALBUMIN 3.6 07/31/2021   AST 14 (L) 07/31/2021   ALT 23 07/31/2021   ALKPHOS  105 07/31/2021   BILITOT 0.3 07/31/2021   GFRNONAA 47 (L) 07/31/2021   GFRAA >60 11/13/2017    Lab Results  Component Value Date   WBC 10.9 (H) 07/31/2021   NEUTROABS 7.1 07/31/2021   HGB 14.9 07/31/2021   HCT 43.9 07/31/2021   MCV 85.1 07/31/2021   PLT 323 07/31/2021     STUDIES: No results found.  ONCOLOGY HISTORY:  Originally diagnosed with a stage IIa (T3N0M0) adenocarcinoma of the right lung in 2016.  She underwent lobectomy at the time, but did not require any further treatment.  In November 2023, she was noted to have a enlarging hypermetabolic left lung nodule that was confirmed to be a second primary given the time in between diagnosis.  She underwent neoadjuvant treatment with carboplatinum, pemetrexed, and nivolumab for 3 cycles and then subsequently had a left lobectomy on April 20, 2022.  She then continued with maintenance nivolumab after her surgery.  This was then discontinued in September 2024 secondary to severe joint pain.  In August 2024 CT chest revealed an enlarging paratracheal mediastinal lymph node concerning for progressive disease and patient subsequently started concurrent chemotherapy with carboplatinum, Taxol, and daily XRT.  Taxol was discontinued secondary to severe reaction and patient was placed on Abraxane.   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  PD-L1 1%, no other actionable mutations.  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  Patient will complete her concurrent chemotherapy with weekly carboplatinum/Abraxane and daily XRT with on December 18, 2022 at [facility].  Once treatment is completed, she has requested all further follow-up and imaging occur here.  Patient will return to clinic in mid December for further evaluation at which point we will discuss the risks and benefit of maintenance immunotherapy, although patient appeared to have a recurrence on nivolumab so the benefit of this is  unclear.  Will reimage with PET scan in early March 2025.  I spent a total of 60 minutes reviewing chart data, face-to-face evaluation with the patient, counseling and coordination of care as detailed above.   Patient expressed understanding and was in agreement with this plan. She also understands that She can call clinic  at any time with any questions, concerns, or complaints.    Cancer Staging  Adenocarcinoma of hilum of right lung [facility]) Staging form: Lung, AJCC 8th Edition - Clinical stage from 11/16/2022: Stage IIB (cT3, cN0, cM0) - Signed by Jeralyn Ruths, MD on 11/16/2022  Adenocarcinoma of left lung [facility]) Staging form: Lung, AJCC 8th Edition - Clinical stage from 11/16/2022: Stage IIIA (cT3, cN1, cM0) - Signed by Jeralyn Ruths, MD on 11/16/2022   Jeralyn Ruths, MD   11/16/2022 4:19 PM

## 2022-11-16 NOTE — Progress Notes (Signed)
Patient states that she has not been happy with her care over at [facility]. Was diagnosed with lung cancer again in October of 2023. Has been receiving tx at [facility] for a year.

## 2022-12-28 ENCOUNTER — Inpatient Hospital Stay: Payer: Managed Care, Other (non HMO) | Attending: Oncology

## 2022-12-28 ENCOUNTER — Inpatient Hospital Stay: Payer: Managed Care, Other (non HMO) | Admitting: Oncology

## 2023-01-11 ENCOUNTER — Encounter: Payer: Self-pay | Admitting: Emergency Medicine

## 2023-01-11 ENCOUNTER — Ambulatory Visit
Admission: EM | Admit: 2023-01-11 | Discharge: 2023-01-11 | Disposition: A | Discharge status: Home / Self Care | Payer: Managed Care, Other (non HMO) | Attending: Family Medicine | Admitting: Family Medicine

## 2023-01-11 ENCOUNTER — Ambulatory Visit (INDEPENDENT_AMBULATORY_CARE_PROVIDER_SITE_OTHER): Payer: Managed Care, Other (non HMO)

## 2023-01-11 DIAGNOSIS — C3490 Malignant neoplasm of unspecified part of unspecified bronchus or lung: Secondary | ICD-10-CM | POA: Diagnosis not present

## 2023-01-11 DIAGNOSIS — J441 Chronic obstructive pulmonary disease with (acute) exacerbation: Secondary | ICD-10-CM

## 2023-01-11 MED ORDER — ONDANSETRON HCL 4 MG/2ML IJ SOLN
4.0000 mg | Freq: Once | INTRAMUSCULAR | Status: AC
  Administered 2023-01-11: 4 mg via INTRAMUSCULAR

## 2023-01-11 MED ORDER — AZITHROMYCIN 250 MG PO TABS: ORAL_TABLET | ORAL | 0 refills | Status: AC

## 2023-01-11 MED ORDER — HYDROCOD POLI-CHLORPHE POLI ER 10-8 MG/5ML PO SUER: 5.0000 mL | Freq: Two times a day (BID) | ORAL | 0 refills | Status: AC | PRN

## 2023-01-11 MED ORDER — ALBUTEROL SULFATE HFA 108 (90 BASE) MCG/ACT IN AERS: 2.0000 | INHALATION_SPRAY | RESPIRATORY_TRACT | 0 refills | Status: AC | PRN

## 2023-01-11 MED ORDER — AEROCHAMBER PLUS FLO-VU MISC: 0 refills | Status: AC

## 2023-01-11 MED ORDER — PREDNISONE 50 MG PO TABS: 50.0000 mg | ORAL_TABLET | Freq: Every day | ORAL | 0 refills | Status: AC

## 2023-01-11 NOTE — ED Provider Notes (Signed)
[facility]    CSN: [id number] Arrival date & time: 01/11/23  1132      History   Chief Complaint Chief Complaint  Patient presents with   Cough   Shortness of Breath    HPI Nancy Combs is a 62 y.o. female.   HPI  History obtained from the patient. Nancy Combs presents for productive cough of clear to yellow sputum, chest congestion and shortness of breath for the past week.  Symptoms are getting worse.  She has known lung cancer and just completed 6 weeks of breathing chemo and radiation.  She has not yet called her oncologist.  Feels like her inhalers are not helping.  No recent fevers.  She restarted smoking back in October when her lung cancer came back after quitting and has since quit again.      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    COPD (chronic obstructive pulmonary disease) (HCC)    Diabetes mellitus without complication (HCC)    Dyspnea    HLD (hyperlipidemia)    Hyperlipidemia    Hypertension    MI (myocardial infarction) (HCC) 02/2017   PCI with DES x 1 to RCA   PV (polycythemia vera) (HCC)     Patient Active Problem List   Diagnosis Date Noted   Adenocarcinoma of left lung (HCC) 11/16/2022   Adenocarcinoma of hilum of right lung (HCC) 11/16/2022   Acute diastolic CHF (congestive heart failure) (HCC)    Morbid obesity (HCC)    Acute respiratory failure with hypoxia (HCC) 08/08/2020   AKI (acute kidney injury) (HCC) 08/08/2020   Atherosclerotic heart disease of native coronary artery without angina pectoris 04/03/2017   GERD (gastroesophageal reflux disease) 02/24/2017   Diabetes mellitus without complication (HCC) 02/24/2017   COPD with acute exacerbation (HCC) 01/23/2017    Past Surgical History:  Procedure Laterality Date   CESAREAN SECTION      CORONARY ANGIOPLASTY WITH STENT PLACEMENT  02/17/2017   4.0 x 18 mm Xience DES to RCA   LUNG LOBECTOMY Right 2016   PERICARDIAL WINDOW  2005   THYROIDECTOMY, PARTIAL  1982   TOTAL KNEE ARTHROPLASTY Left 11/19/2019   Procedure: TOTAL KNEE ARTHROPLASTY;  Surgeon: Christena Flake, MD;  Location: [facility] ORS;  Service: Orthopedics;  Laterality: Left;   TOTAL KNEE REVISION Left 08/04/2020   Procedure: POLYETHYLENE EXCHANGE OF LEFT KNEE;  Surgeon: Christena Flake, MD;  Location: [facility] ORS;  Service: Orthopedics;  Laterality: Left;    OB History   No obstetric history on file.      Home Medications    Prior to Admission medications   Medication Sig Start Date End Date Taking? Authorizing Provider  albuterol (VENTOLIN HFA) 108 (90 Base) MCG/ACT inhaler Inhale 2 puffs into the lungs every 4 (four) hours as needed. 01/11/23  Yes Kerston Landeck, DO  azithromycin (ZITHROMAX Z-PAK) 250 MG tablet Take 2 tablets on day 1 then 1 tablet daily 01/11/23  Yes Norm Wray, DO  chlorpheniramine-HYDROcodone (TUSSIONEX) 10-8 MG/5ML Take 5 mLs by mouth every 12 (twelve) hours as needed. 01/11/23  Yes Melitta Tigue, DO  predniSONE (DELTASONE) 50 MG tablet Take 1 tablet (50 mg total) by mouth daily for 5 days. 01/11/23 01/16/23 Yes Paxon Propes, Seward Meth, DO  Spacer/Aero-Holding Chambers (AEROCHAMBER PLUS) Device Use with  inhalers 01/11/23  Yes Kayton Dunaj, DO  acetaminophen (TYLENOL) 325 MG tablet Take 650 mg by mouth every 6 (six) hours as needed for moderate pain. Patient not taking: Reported on 11/16/2022    [provider]  amLODipine (NORVASC) 10 MG tablet Take 1 tablet (10 mg total) by mouth daily. 11/13/17   Sudie Grumbling, NP  apixaban (ELIQUIS) 2.5 MG TABS tablet Take 1 tablet (2.5 mg total) by mouth 2 (two) times daily. Patient not taking: Reported on 11/16/2022 08/05/20   Poggi, Excell Seltzer, MD  clopidogrel (PLAVIX) 75 MG tablet Take 75 mg by mouth daily.    [provider]  EPINEPHrine 0.3  mg/0.3 mL IJ SOAJ injection Inject 0.3 mg into the muscle as needed. 11/07/22   [provider]  folic acid (FOLVITE) 1 MG tablet Take 1 mg by mouth daily.    [provider]  glipiZIDE (GLUCOTROL) 5 MG tablet Take 10 mg by mouth 2 (two) times daily at 8 am and 10 pm. 10/21/19   [provider]  hydrochlorothiazide (HYDRODIURIL) 25 MG tablet Take 25 mg by mouth daily.    [provider]  JANUVIA 100 MG tablet Take 100 mg by mouth daily.    [provider]  levothyroxine (SYNTHROID) 50 MCG tablet Take 50 mcg by mouth daily.    [provider]  loratadine (CLARITIN) 10 MG tablet Take 10 mg by mouth daily as needed. 01/23/17   [provider]  LORazepam (ATIVAN) 0.5 MG tablet Take 0.5 mg by mouth every 8 (eight) hours as needed. 06/15/22   [provider]  losartan (COZAAR) 100 MG tablet Take 1 tablet (100 mg total) by mouth daily. 11/13/17   Sudie Grumbling, NP  metFORMIN (GLUCOPHAGE-XR) 500 MG 24 hr tablet Take 500 mg by mouth See admin instructions. 09/29/19   [provider]  metoprolol succinate (TOPROL-XL) 100 MG 24 hr tablet Take 1 tablet (100 mg total) by mouth daily. Take with or immediately following a meal. 11/13/17   Amyot, Ali Lowe, NP  pantoprazole (PROTONIX) 40 MG tablet Take 40 mg by mouth daily.    [provider]  simvastatin (ZOCOR) 20 MG tablet Take 1 tablet (20 mg total) by mouth daily. 11/13/17   Sudie Grumbling, NP  Tiotropium Bromide-Olodaterol (STIOLTO RESPIMAT) 2.5-2.5 MCG/ACT AERS Inhale 2 puffs into the lungs daily. 11/13/17   Sudie Grumbling, NP  traMADol (ULTRAM) 50 MG tablet Take 50 mg by mouth every 6 (six) hours as needed. 10/04/22   [provider]  varenicline (CHANTIX) 1 MG tablet Take 1 mg by mouth 2 (two) times daily. Patient not taking: Reported on 11/16/2022    [provider]    Family History Family History  Problem Relation Age of Onset   Diabetes  Mother    Hypertension Mother     Social History Social History   Tobacco Use   Smoking status: Some Days    Current packs/day: 0.00    Types: Cigarettes    Last attempt to quit: 08/26/2019    Years since quitting: 3.3   Smokeless tobacco: Never  Vaping Use   Vaping status: Never Used  Substance Use Topics   Alcohol use: Never   Drug use: Never     Allergies   Paclitaxel and Nicotine   Review of Systems Review of Systems: negative unless otherwise stated in HPI.      Physical Exam Triage Vital Signs ED Triage Vitals  Encounter Vitals Group  BP 01/11/23 1208 (!) 157/85     Systolic BP Percentile --      Diastolic BP Percentile --      Pulse Rate 01/11/23 1208 70     Resp 01/11/23 1208 20     Temp 01/11/23 1208 98.2 F (36.8 C)     Temp Source 01/11/23 1208 Oral     SpO2 01/11/23 1208 95 %     Weight 01/11/23 1206 201 lb 15.1 oz (91.6 kg)     Height 01/11/23 1206 5\' 2"  (1.575 m)     Head Circumference --      Peak Flow --      Pain Score 01/11/23 1205 0     Pain Loc --      Pain Education --      Exclude from Growth Chart --    No data found.  Updated Vital Signs BP (!) 157/85 (BP Location: Left Arm)   Pulse 70   Temp 98.2 F (36.8 C) (Oral)   Resp 20   Ht 5\' 2"  (1.575 m)   Wt 91.6 kg   SpO2 95%   BMI 36.94 kg/m   Visual Acuity Right Eye Distance:   Left Eye Distance:   Bilateral Distance:    Right Eye Near:   Left Eye Near:    Bilateral Near:     Physical Exam GEN:     alert, non-toxic appearing female in no distress, long strands of hair on her sweater   HENT:  mucus membranes moist, no nasal discharge EYES:   no scleral injection or discharge RESP:  no increased work of breathing, coarse breath sounds bilaterally, expiratory wheezing CVS:   regular rate and rhythm Skin:   warm and dry    UC Treatments / Results  Labs (all labs ordered are listed, but only abnormal results are displayed) Labs Reviewed - No data to  display  EKG   Radiology DG Chest 2 View Result Date: 01/11/2023 CLINICAL DATA:  Cough and congestion for a week. History of lung cancer EXAM: CHEST - 2 VIEW COMPARISON:  X-ray 08/10/2022 and older. FINDINGS: Surgical changes along the right midthorax with volume loss. Stable nodularity along the right lung hilum. Small bilateral pleural effusions. Old right-sided healed rib fractures. No pneumothorax, edema or consolidation. Vague densities in the left mid and upper lung are more faint today than previous. Stable cardiopericardial silhouette. Degenerative changes along the spine. IMPRESSION: Decreasing ill-defined nodular areas in the left lung with some residual. Recommend continued close follow-up. Stable surgical changes in the right hemithorax. Persistent bilateral pleural effusion or pleural thickening. Electronically Signed   By: Karen Kays M.D.   On: 01/11/2023 13:15    Procedures Procedures (including critical care time)  Medications Ordered in UC Medications  ondansetron (ZOFRAN) injection 4 mg (4 mg Intramuscular Given 01/11/23 1338)    Initial Impression / Assessment and Plan / UC Course  I have reviewed the triage vital signs and the nursing notes.  Pertinent labs & imaging results that were available during my care of the patient were reviewed by me and considered in my medical decision making (see chart for details).       Pt is a 62 y.o. female who has right adenocarcinoma of the lung and COPD presents for shortness of breath and productive cough for the past week.  Zofran 4 mg IM given for her vomiting while in the urgent care.  She had a CT chest on 01/10/2023 that showed multiple areas  of mucous plugging, unchanged size of enlarged left lower paratracheal lymph node with new adjacent fat stranding, likely posttreatment change and slightly increased size of left supraclavicular lymph nodes, which remain subcentimeter. Unchanged pulmonary nodules.   Nancy Combs is  afebrile here without recent antipyretics. Satting 95% on room air. Overall pt is non-toxic appearing, well hydrated, without respiratory distress. Pulmonary exam is remarkable for coarse breath sounds bilaterally and expiratory wheezing.  COVID, influenza and RSV deferred due to duration of symptoms.  Recommended chest x-ray and she is agreeable. Chest xray personally reviewed by me without focal pneumonia, , cardiomegaly or pneumothorax.  Radiologist notes persistent bilateral pleural effusion versus pleural thickening.  Suspect COPD exacerbation.  Treat with antibiotics and steroids as below.  Patient requested a spacer to help her inhale her albuterol.  Albuterol and spacer ordered.  Advised patient to call her oncologist to let them know this is going on.  She has an appointment next Tuesday.  Typical duration of symptoms discussed.  Tussionex for cough.  Return and ED precautions given and voiced understanding. Discussed MDM, treatment plan and plan for follow-up with patient who agrees with plan.     Final Clinical Impressions(s) / UC Diagnoses   Final diagnoses:  COPD exacerbation (HCC)  Malignant neoplasm of lung, unspecified laterality, unspecified part of lung [facility])     Discharge Instructions      Stop by the pharmacy to pick up your prescriptions.  Follow up with your oncologist.    Go to ED for red flag symptoms, including; fevers you cannot reduce with Tylenol/Motrin, severe headaches, vision changes, numbness/weakness in part of the body, lethargy, confusion, intractable vomiting, severe dehydration, chest pain, breathing difficulty, severe persistent abdominal or pelvic pain, signs of severe infection (increased redness, swelling of an area), feeling faint or passing out, dizziness, etc. You should especially go to the ED for sudden acute worsening of condition if you do not elect to go at this time.         ED Prescriptions     Medication Sig Dispense Auth. Provider    azithromycin (ZITHROMAX Z-PAK) 250 MG tablet Take 2 tablets on day 1 then 1 tablet daily 6 tablet Haytham Maher, DO   predniSONE (DELTASONE) 50 MG tablet Take 1 tablet (50 mg total) by mouth daily for 5 days. 5 tablet Kensli Bowley, Seward Meth, DO   Spacer/Aero-Holding Chambers (AEROCHAMBER PLUS) Device Use with inhalers 1 each Rhonda Vangieson, DO   chlorpheniramine-HYDROcodone (TUSSIONEX) 10-8 MG/5ML Take 5 mLs by mouth every 12 (twelve) hours as needed. 115 mL Jubal Rademaker, DO   albuterol (VENTOLIN HFA) 108 (90 Base) MCG/ACT inhaler Inhale 2 puffs into the lungs every 4 (four) hours as needed. 6.7 g Katha Cabal, DO      I have reviewed the PDMP during this encounter.   Katha Cabal, DO 01/11/23 1445

## 2023-01-11 NOTE — ED Triage Notes (Signed)
Patient reports cough and chest congestion that started a week ago.  Patient states that her symptoms had improved some but yesterday yer chest congestion has gotten thicker and worse.  Patient reports some SOB.  Patient denies recent fevers.

## 2023-01-11 NOTE — Discharge Instructions (Signed)
Stop by the pharmacy to pick up your prescriptions.  Follow up with your oncologist.    Go to ED for red flag symptoms, including; fevers you cannot reduce with Tylenol/Motrin, severe headaches, vision changes, numbness/weakness in part of the body, lethargy, confusion, intractable vomiting, severe dehydration, chest pain, breathing difficulty, severe persistent abdominal or pelvic pain, signs of severe infection (increased redness, swelling of an area), feeling faint or passing out, dizziness, etc. You should especially go to the ED for sudden acute worsening of condition if you do not elect to go at this time.

## 2024-01-29 ENCOUNTER — Ambulatory Visit

## 2024-01-29 ENCOUNTER — Encounter: Payer: Self-pay | Admitting: Emergency Medicine

## 2024-01-29 ENCOUNTER — Ambulatory Visit
Admission: EM | Admit: 2024-01-29 | Discharge: 2024-01-29 | Attending: Emergency Medicine | Admitting: Emergency Medicine

## 2024-01-29 DIAGNOSIS — R051 Acute cough: Secondary | ICD-10-CM

## 2024-01-29 DIAGNOSIS — J90 Pleural effusion, not elsewhere classified: Secondary | ICD-10-CM

## 2024-01-29 LAB — POC COVID19/FLU A&B COMBO
Covid Antigen, POC: NEGATIVE
Influenza A Antigen, POC: NEGATIVE
Influenza B Antigen, POC: NEGATIVE

## 2024-01-29 NOTE — ED Notes (Signed)
 Patient is being discharged from the Urgent Care and sent to the Emergency Department via POV . Per Venetia Motto NP, patient is in need of higher level of care due to pleural effusion. Patient is aware and verbalizes understanding of plan of care.  Vitals:   01/29/24 1208  BP: (!) 167/100  Pulse: 86  Resp: 17  Temp: 98.1 F (36.7 C)  SpO2: 100%

## 2024-01-29 NOTE — ED Provider Notes (Signed)
 " [facility]    CSN: [id number] Arrival date & time: 01/29/24  1143      History   Chief Complaint Chief Complaint  Patient presents with   Cough   Shortness of Breath   Generalized Body Aches   Nasal Congestion    HPI Nancy Combs is a 64 y.o. female.   HPI  64 year old female with past medical history significant for lung cancer, CAD, dual antiplatelet therapy, chronic kidney disease, COPD, diabetes, hyperlipidemia, hypertension, history of MI, polycythemia vera, and diastolic congestive heart failure presents for evaluation of respiratory symptoms that began yesterday when she was flying back from [place] .  These include body aches, runny nose for yellow-green nasal discharge, cough productive for yellow-green sputum, shortness breath, and wheezing.  She denies any chest pain but has had some mild lower extremity edema.  No fever.  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   Adenocarcinoma of hilum of right lung (HCC) 11/16/2022   Acute diastolic CHF (congestive heart failure) (HCC)    Morbid obesity (HCC)    Acute respiratory failure with hypoxia (HCC) 08/08/2020   AKI (acute kidney injury) 08/08/2020   Atherosclerotic heart disease of native coronary artery without angina pectoris 04/03/2017   GERD (gastroesophageal reflux disease) 02/24/2017   Diabetes mellitus without complication (HCC) 02/24/2017    COPD with acute exacerbation (HCC) 01/23/2017    Past Surgical History:  Procedure Laterality Date   CESAREAN SECTION     CORONARY ANGIOPLASTY WITH STENT PLACEMENT  02/17/2017   4.0 x 18 mm Xience DES to RCA   LUNG LOBECTOMY Right 2016   PERICARDIAL WINDOW  2005   THYROIDECTOMY, PARTIAL  1982   TOTAL KNEE ARTHROPLASTY Left 11/19/2019   Procedure: TOTAL KNEE ARTHROPLASTY;  Surgeon: Edie Norleen PARAS, MD;  Location: [facility] ORS;  Service: Orthopedics;  Laterality: Left;   TOTAL KNEE REVISION Left 08/04/2020   Procedure: POLYETHYLENE EXCHANGE OF LEFT KNEE;  Surgeon: Edie Norleen PARAS, MD;  Location: [facility] OR[facility];  Service: Orthopedics;  Laterality: Left;    OB History   No obstetric history on file.      Home Medications    Prior to Admission medications  Medication Sig Start Date End Date Taking? Authorizing Provider  atorvastatin (LIPITOR) 80 MG tablet Take 80 mg by mouth. 08/28/23  Yes [provider]  spironolactone (ALDACTONE) 25 MG tablet Take 25 mg by mouth daily. 08/28/23 01/09/25 Yes [provider]  acetaminophen  (TYLENOL ) 325 MG tablet Take 650 mg by mouth every 6 (six) hours as needed for moderate pain. Patient not taking: Reported on 11/16/2022    [provider]  albuterol  (VENTOLIN  HFA) 108 (90 Base) MCG/ACT inhaler Inhale 2 puffs into the lungs every 4 (four) hours as needed. 01/11/23   Brimage, Vondra, DO  amLODipine  (NORVASC ) 10 MG tablet Take 1  tablet (10 mg total) by mouth daily. 11/13/17   Pearl Jenkins Lesches, NP  apixaban  (ELIQUIS ) 2.5 MG TABS tablet Take 1 tablet (2.5 mg total) by mouth 2 (two) times daily. Patient not taking: Reported on 11/16/2022 08/05/20   Poggi, Norleen PARAS, MD  azithromycin  (ZITHROMAX  Z-PAK) 250 MG tablet Take 2 tablets on day 1 then 1 tablet daily 01/11/23   Brimage, Vondra, DO  chlorpheniramine-HYDROcodone  (TUSSIONEX) 10-8 MG/5ML Take 5 mLs by mouth every 12 (twelve) hours as needed. 01/11/23   Brimage, Vondra, DO  clopidogrel  (PLAVIX )  75 MG tablet Take 75 mg by mouth daily.    [provider]  EPINEPHrine  0.3 mg/0.3 mL IJ SOAJ injection Inject 0.3 mg into the muscle as needed. 11/07/22   [provider]  folic acid (FOLVITE) 1 MG tablet Take 1 mg by mouth daily.    [provider]  glipiZIDE  (GLUCOTROL ) 5 MG tablet Take 10 mg by mouth 2 (two) times daily at 8 am and 10 pm. 10/21/19   [provider]  hydrochlorothiazide (HYDRODIURIL) 25 MG tablet Take 25 mg by mouth daily.    [provider]  JANUVIA 100 MG tablet Take 100 mg by mouth daily.    [provider]  levothyroxine (SYNTHROID) 50 MCG tablet Take 50 mcg by mouth daily.    [provider]  loratadine (CLARITIN) 10 MG tablet Take 10 mg by mouth daily as needed. 01/23/17   [provider]  LORazepam (ATIVAN) 0.5 MG tablet Take 0.5 mg by mouth every 8 (eight) hours as needed. 06/15/22   [provider]  losartan  (COZAAR ) 100 MG tablet Take 1 tablet (100 mg total) by mouth daily. 11/13/17   Pearl Jenkins Lesches, NP  metFORMIN (GLUCOPHAGE-XR) 500 MG 24 hr tablet Take 500 mg by mouth See admin instructions. 09/29/19   [provider]  metoprolol  succinate (TOPROL -XL) 100 MG 24 hr tablet Take 1 tablet (100 mg total) by mouth daily. Take with or immediately following a meal. 11/13/17   Amyot, Jenkins Lesches, NP  pantoprazole (PROTONIX) 40 MG tablet Take 40 mg by mouth daily.    [provider]  simvastatin  (ZOCOR ) 20 MG tablet Take 1 tablet (20 mg total) by mouth daily. 11/13/17   Pearl Jenkins Lesches, NP  Spacer/Aero-Holding Chambers (AEROCHAMBER PLUS) Device Use with inhalers 01/11/23   Brimage, Vondra, DO  Tiotropium Bromide -Olodaterol (STIOLTO RESPIMAT ) 2.5-2.5 MCG/ACT AERS Inhale 2 puffs into the lungs daily. 11/13/17   Pearl Jenkins Lesches, NP  traMADol (ULTRAM) 50 MG tablet Take 50 mg by mouth every 6 (six) hours as needed. 10/04/22   [provider]  varenicline (CHANTIX) 1 MG tablet Take  1 mg by mouth 2 (two) times daily. Patient not taking: Reported on 11/16/2022    [provider]    Family History Family History  Problem Relation Age of Onset   Diabetes Mother    Hypertension Mother     Social History Social History[1]   Allergies   Paclitaxel, Wound dressing adhesive, Hydrochlorothiazide, and Nicotine   Review of Systems Review of Systems  Constitutional:  Negative for fever.  HENT:  Positive for congestion and rhinorrhea. Negative for ear pain and sore throat.   Respiratory:  Positive for cough, shortness of breath and wheezing.   Cardiovascular:  Positive for leg swelling. Negative for chest pain.     Physical Exam Triage Vital Signs ED Triage Vitals  Encounter Vitals Group     BP      Girls  Systolic BP Percentile      Girls Diastolic BP Percentile      Boys Systolic BP Percentile      Boys Diastolic BP Percentile      Pulse      Resp      Temp      Temp src      SpO2      Weight      Height      Head Circumference      Peak Flow      Pain Score      Pain Loc      Pain Education      Exclude from Growth Chart    No data found.  Updated Vital Signs BP (!) 167/100 (BP Location: Right Arm)   Pulse 86   Temp 98.1 F (36.7 C) (Oral)   Resp 17   Wt 192 lb (87.1 kg)   SpO2 100%   BMI 35.12 kg/m   Visual Acuity Right Eye Distance:   Left Eye Distance:   Bilateral Distance:    Right Eye Near:   Left Eye Near:    Bilateral Near:     Physical Exam Vitals and nursing note reviewed.  Constitutional:      Appearance: Normal appearance. She is not ill-appearing.  HENT:     Head: Normocephalic and atraumatic.     Right Ear: Tympanic membrane, ear canal and external ear normal. There is no impacted cerumen.     Left Ear: Tympanic membrane, ear canal and external ear normal. There is no impacted cerumen.     Nose: Congestion and rhinorrhea present.     Comments: Nasal mucosa is erythematous and mildly edematous with scant  clear discharge in both nares.    Mouth/Throat:     Mouth: Mucous membranes are moist.     Pharynx: Oropharynx is clear. No oropharyngeal exudate or posterior oropharyngeal erythema.  Cardiovascular:     Rate and Rhythm: Normal rate and regular rhythm.     Pulses: Normal pulses.     Heart sounds: Normal heart sounds. No murmur heard.    No friction rub. No gallop.  Pulmonary:     Effort: Pulmonary effort is normal.     Breath sounds: Wheezing present.  Musculoskeletal:     Cervical back: Normal range of motion and neck supple. No tenderness.     Right lower leg: Edema present.     Left lower leg: Edema present.     Comments: Mild, nonpitting, lower extremity edema.  Bilateral pulses are 2+.  Lymphadenopathy:     Cervical: No cervical adenopathy.  Skin:    General: Skin is warm and dry.     Capillary Refill: Capillary refill takes less than 2 seconds.     Findings: Bruising present. No erythema or rash.     Comments: Scattered bruising present.  Neurological:     General: No focal deficit present.     Mental Status: She is alert and oriented to person, place, and time.      UC Treatments / Results  Labs (all labs ordered are listed, but only abnormal results are displayed) Labs Reviewed  POC COVID19/FLU A&B COMBO - Normal    EKG Normal sinus rhythm with a ventricular rate of 88 bpm PR interval 190 ms QRS duration 90 ms QT/QTc 390/470 ms Incomplete right bundle branch block, left posterior fascicular block, possible left atrial enlargement.  Radiology DG Chest 2 View Result Date: 01/29/2024 CLINICAL DATA:  Cough,  shortness of breath EXAM: CHEST - 2 VIEW COMPARISON:  January 11, 2023 FINDINGS: Stable cardiomediastinal silhouette. Mild left basilar scarring or subsegmental atelectasis is noted. Blunting of both costophrenic angles is noted suggesting effusions or pleural thickening. Surgical clips are seen in right perihilar region. IMPRESSION: Mild left basilar  subsegmental atelectasis or scarring. Blunting of both costophrenic angles is noted suggesting small effusions or pleural thickening. Electronically Signed   By: Lynwood Landy Raddle M.D.   On: 01/29/2024 12:59    Procedures Procedures (including critical care time)  Medications Ordered in UC Medications - No data to display  Initial Impression / Assessment and Plan / UC Course  I have reviewed the triage vital signs and the nursing notes.  Pertinent labs & imaging results that were available during my care of the patient were reviewed by me and considered in my medical decision making (see chart for details).   Patient is a nontoxic-appearing 64 year old female presenting for evaluation respiratory symptoms as outlined in HPI above.  She reports that the symptoms began when she was flying back from [place] .  She reports that she was going different airplanes all day long.  She has had some very mild lower extremity edema which may be secondary to her legs being dependent for air travel.  She denies any chest pain.  Given her cluster of symptoms and recent travel I will order a COVID and influenza antigen test.  I will also obtain an EKG to evaluate for any evidence of heart strain and chest x-ray to evaluate for any acute cardiopulmonary pathology.  COVID and influenza antigen testing are negative.  Chest x-ray independently reviewed and evaluated by me.  Impression: Postsurgical changes reflecting the left lower lobe lobotomy present.  Multiple surgical clips present throughout the chest cavity.  Blunting of the costophrenic angle on the right.  Cardiomediastinal silhouette is enlarged.  Radiology impression is pending. Radiology impression states mild left basilar subsegmental atelectasis or scarring.  Blunting of both costophrenic angles is noted suggesting small effusions or pleural thickening.  Patient's EKG showing normal sinus rhythm with possible left atrial enlargement, incomplete right  bundle, and left posterior fascicular block.  When compared to EKG dated 08/08/2020 she is now in sinus rhythm whereas at that time she was in sinus rhythm with a first-degree AV block.  The incomplete right bundle is also present with the possible left atrial enlargement.  Left posterior fascicular block is new without any significant change in morphology.  I will discharge patient on the diagnosis of bilateral pleural effusions to the emergency department.  She is elected to go to [facility].   Final Clinical Impressions(s) / UC Diagnoses   Final diagnoses:  Acute cough  Pleural effusion, bilateral     Discharge Instructions      To the emergency department to be evaluated for your shortness of breath and the pleural effusions in both of your lung bases.  Please go now.     ED Prescriptions   None    PDMP not reviewed this encounter.     [1]  Social History Tobacco Use   Smoking status: Some Days    Current packs/day: 0.00    Average packs/day: 0.5 packs/day    Types: Cigarettes    Last attempt to quit: 08/26/2019    Years since quitting: 4.4   Smokeless tobacco: Never  Vaping Use   Vaping status: Never Used  Substance Use Topics   Alcohol use: Never   Drug use: Never  Bernardino Ditch, NP 01/29/24 1309  "

## 2024-01-29 NOTE — ED Triage Notes (Signed)
 Pt presents with a cough, runny nose, SOB, and bodyaches since last night. Pt has taken NYQuil for her symptoms.

## 2024-01-29 NOTE — Discharge Instructions (Addendum)
 To the emergency department to be evaluated for your shortness of breath and the pleural effusions in both of your lung bases.  Please go now.
